# Patient Record
Sex: Female | Born: 1975
Health system: Southern US, Community
[De-identification: ages and names within clinical notes are randomized; demographics above are authoritative.]

## PROBLEM LIST (undated history)

## (undated) DIAGNOSIS — M1990 Unspecified osteoarthritis, unspecified site: Secondary | ICD-10-CM

## (undated) HISTORY — PX: OTHER SURGICAL HISTORY: SHX169

---

## 2000-08-30 ENCOUNTER — Other Ambulatory Visit: Admission: RE | Admit: 2000-08-30 | Discharge: 2000-08-30 | Payer: Self-pay | Admitting: Obstetrics and Gynecology

## 2001-02-16 ENCOUNTER — Other Ambulatory Visit: Admission: RE | Admit: 2001-02-16 | Discharge: 2001-02-16 | Payer: Self-pay | Admitting: Obstetrics and Gynecology

## 2001-05-03 ENCOUNTER — Encounter (INDEPENDENT_AMBULATORY_CARE_PROVIDER_SITE_OTHER): Payer: Self-pay

## 2001-05-03 ENCOUNTER — Other Ambulatory Visit: Admission: RE | Admit: 2001-05-03 | Discharge: 2001-05-03 | Payer: Self-pay | Admitting: Obstetrics and Gynecology

## 2001-10-01 ENCOUNTER — Other Ambulatory Visit: Admission: RE | Admit: 2001-10-01 | Discharge: 2001-10-01 | Payer: Self-pay | Admitting: Obstetrics and Gynecology

## 2002-04-22 ENCOUNTER — Other Ambulatory Visit: Admission: RE | Admit: 2002-04-22 | Discharge: 2002-04-22 | Payer: Self-pay | Admitting: Obstetrics and Gynecology

## 2002-11-29 ENCOUNTER — Other Ambulatory Visit: Admission: RE | Admit: 2002-11-29 | Discharge: 2002-11-29 | Payer: Self-pay | Admitting: Obstetrics and Gynecology

## 2004-02-05 ENCOUNTER — Other Ambulatory Visit: Admission: RE | Admit: 2004-02-05 | Discharge: 2004-02-05 | Payer: Self-pay | Admitting: Obstetrics and Gynecology

## 2005-03-23 ENCOUNTER — Other Ambulatory Visit: Admission: RE | Admit: 2005-03-23 | Discharge: 2005-03-23 | Payer: Self-pay | Admitting: Obstetrics and Gynecology

## 2006-03-23 ENCOUNTER — Ambulatory Visit (HOSPITAL_COMMUNITY): Admission: RE | Admit: 2006-03-23 | Discharge: 2006-03-23 | Payer: Self-pay | Admitting: Specialist

## 2009-03-07 ENCOUNTER — Encounter: Payer: Self-pay | Admitting: Orthopedic Surgery

## 2009-03-07 ENCOUNTER — Emergency Department (HOSPITAL_COMMUNITY): Admission: EM | Admit: 2009-03-07 | Discharge: 2009-03-07 | Payer: Self-pay | Admitting: Emergency Medicine

## 2009-03-09 ENCOUNTER — Ambulatory Visit: Payer: Self-pay | Admitting: Orthopedic Surgery

## 2009-03-09 DIAGNOSIS — S8263XA Displaced fracture of lateral malleolus of unspecified fibula, initial encounter for closed fracture: Secondary | ICD-10-CM

## 2009-03-17 ENCOUNTER — Encounter: Payer: Self-pay | Admitting: Orthopedic Surgery

## 2009-04-20 ENCOUNTER — Ambulatory Visit: Payer: Self-pay | Admitting: Orthopedic Surgery

## 2009-06-25 ENCOUNTER — Ambulatory Visit: Payer: Self-pay | Admitting: Orthopedic Surgery

## 2009-06-25 DIAGNOSIS — M2240 Chondromalacia patellae, unspecified knee: Secondary | ICD-10-CM

## 2009-06-26 ENCOUNTER — Encounter (INDEPENDENT_AMBULATORY_CARE_PROVIDER_SITE_OTHER): Payer: Self-pay | Admitting: *Deleted

## 2009-07-03 ENCOUNTER — Ambulatory Visit (HOSPITAL_COMMUNITY): Admission: RE | Admit: 2009-07-03 | Discharge: 2009-07-03 | Payer: Self-pay | Admitting: Orthopedic Surgery

## 2009-07-29 ENCOUNTER — Ambulatory Visit: Payer: Self-pay | Admitting: Orthopedic Surgery

## 2009-08-11 ENCOUNTER — Encounter (HOSPITAL_COMMUNITY): Admission: RE | Admit: 2009-08-11 | Discharge: 2009-09-10 | Payer: Self-pay | Admitting: Orthopedic Surgery

## 2009-09-11 ENCOUNTER — Encounter (HOSPITAL_COMMUNITY): Admission: RE | Admit: 2009-09-11 | Discharge: 2009-10-11 | Payer: Self-pay | Admitting: Orthopedic Surgery

## 2009-09-16 ENCOUNTER — Encounter: Payer: Self-pay | Admitting: Orthopedic Surgery

## 2009-10-21 ENCOUNTER — Ambulatory Visit: Payer: Self-pay | Admitting: Orthopedic Surgery

## 2010-03-19 HISTORY — PX: CHOLECYSTECTOMY: SHX55

## 2010-03-19 HISTORY — PX: DIAGNOSTIC LAPAROSCOPY: SUR761

## 2011-04-21 ENCOUNTER — Emergency Department (HOSPITAL_COMMUNITY)
Admission: EM | Admit: 2011-04-21 | Discharge: 2011-04-22 | Disposition: A | Discharge status: Home / Self Care | Payer: Medicaid Other | Attending: Emergency Medicine | Admitting: Emergency Medicine

## 2011-04-21 DIAGNOSIS — R05 Cough: Secondary | ICD-10-CM | POA: Insufficient documentation

## 2011-04-21 DIAGNOSIS — R059 Cough, unspecified: Secondary | ICD-10-CM | POA: Insufficient documentation

## 2011-04-21 DIAGNOSIS — J209 Acute bronchitis, unspecified: Secondary | ICD-10-CM | POA: Insufficient documentation

## 2011-04-23 ENCOUNTER — Emergency Department (HOSPITAL_COMMUNITY)
Admission: EM | Admit: 2011-04-23 | Discharge: 2011-04-23 | Disposition: A | Discharge status: Home / Self Care | Payer: Self-pay | Attending: Emergency Medicine | Admitting: Emergency Medicine

## 2011-04-23 ENCOUNTER — Emergency Department (HOSPITAL_COMMUNITY): Payer: Self-pay

## 2011-04-23 DIAGNOSIS — J45909 Unspecified asthma, uncomplicated: Secondary | ICD-10-CM | POA: Insufficient documentation

## 2011-04-23 DIAGNOSIS — Z79899 Other long term (current) drug therapy: Secondary | ICD-10-CM | POA: Insufficient documentation

## 2011-05-06 NOTE — H&P (Signed)
NAME:  KARRIS, DEANGELO              ACCOUNT NO.:  [id number]   MEDICAL RECORD NO.:  [id number]          PATIENT TYPE:  AMB   LOCATION:                                FACILITY:  [facility]   PHYSICIAN:  Dirk Dress. Katrinka Blazing, M.D.   DATE OF BIRTH:  [birth year]   DATE OF ADMISSION:  DATE OF DISCHARGE:  LH                                HISTORY & PHYSICAL   This is a 35 year old female with history of progressive severe  constipation.  She has had episodic rectal bleeding, the last episode was  two weeks ago.  She had a strong family history of colon cancer and she is  referred for colonoscopy.   PAST MEDICAL HISTORY:  She has no medical illness.  Her only medication is  Depo-Provera for contraception.   ALLERGIES:  None.   PAST SURGICAL HISTORY:  None.   PHYSICAL EXAMINATION:  VITAL SIGNS:  Blood pressure 120/79, pulse 85,  respirations 18, weight 192 pounds.  HEENT:  Unremarkable.  NECK:  Supple.  No JVD, bruit, adenopathy or thyromegaly.  CHEST:  Clear to auscultation.  HEART:  Regular rate and rhythm without murmur, rub, or gallop.  ABDOMEN:  Soft and nontender.  No masses.  EXTREMITIES:  No clubbing, cyanosis, or edema.  NEUROLOGICAL:  No focal motor, sensory or cerebellar deficit.   IMPRESSION:  1.  Rectal bleeding, recurrent.  2.  Constipation.  3.  Family history of colon cancer.   PLAN:  Total colonoscopy.      Dirk Dress. Katrinka Blazing, M.D.  Electronically Signed     LCS/MEDQ  D:  03/22/2006  T:  03/22/2006  Job:  [id number]

## 2012-02-29 ENCOUNTER — Encounter: Payer: Self-pay | Admitting: Orthopedic Surgery

## 2012-02-29 ENCOUNTER — Telehealth: Payer: Self-pay | Admitting: Orthopedic Surgery

## 2012-02-29 ENCOUNTER — Ambulatory Visit (INDEPENDENT_AMBULATORY_CARE_PROVIDER_SITE_OTHER): Payer: Medicaid Other | Admitting: Orthopedic Surgery

## 2012-02-29 VITALS — BP 98/60 | Ht 69.0 in | Wt 215.0 lb

## 2012-02-29 DIAGNOSIS — M2240 Chondromalacia patellae, unspecified knee: Secondary | ICD-10-CM

## 2012-02-29 DIAGNOSIS — M2241 Chondromalacia patellae, right knee: Secondary | ICD-10-CM | POA: Insufficient documentation

## 2012-02-29 MED ORDER — IBUPROFEN 800 MG PO TABS: 800.0000 mg | ORAL_TABLET | Freq: Three times a day (TID) | ORAL | Status: AC | PRN

## 2012-02-29 MED ORDER — TRAMADOL-ACETAMINOPHEN 37.5-325 MG PO TABS: 1.0000 | ORAL_TABLET | ORAL | Status: AC | PRN

## 2012-02-29 NOTE — Progress Notes (Signed)
Patient ID: Stacie Roberts, female   DOB: Nov 13, 1976, 36 y.o.   MRN: [id number] Knee  Injection Procedure Note  Pre-operative Diagnosis: left knee oa  Post-operative Diagnosis: same  Indications: pain  Anesthesia: ethyl chloride   Procedure Details   Verbal consent was obtained for the procedure. Time out was completed.The joint was prepped with alcohol, followed by  Ethyl chloride spray and A 20 gauge needle was inserted into the knee via lateral approach; 4ml 1% lidocaine and 1 ml of depomedrol  was then injected into the joint . The needle was removed and the area cleansed and dressed.  Complications:  None; patient tolerated the procedure well.  Knee  Injection Procedure Note  Pre-operative Diagnosis: right knee oa  Post-operative Diagnosis: same  Indications: pain  Anesthesia: ethyl chloride   Procedure Details   Verbal consent was obtained for the procedure. Time out was completed.The joint was prepped with alcohol, followed by  Ethyl chloride spray and A 20 gauge needle was inserted into the knee via lateral approach; 4ml 1% lidocaine and 1 ml of depomedrol  was then injected into the joint . The needle was removed and the area cleansed and dressed.  Complications:  None; patient tolerated the procedure well.   Subjective:    Stacie Roberts is a 36 y.o. female who presents her current bilateral anterior knee pain LEFT greater than RIGHT.  I saw her back in 2010 she had an MRI of the LEFT knee showed chondromalacia was otherwise normal exam  She continues now with anterior knee pain bilaterally LEFT greater than RIGHT she feels like the knee is swollen.  She has anterior and posterior joint pain.  She has difficulty going up and down the steps.  The knee feels like it will give out at any point.  There's been no new injury.  She's lost 17 pounds on weight loss medication  She has NO KNOWN DRUG ALLERGIES.  She has a history of colitis.  She's had a colonoscopy.  She is a  family history of diabetes and coronary artery disease as well as asthma.  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  Examination shows a well-developed well-nourished female mildly obese.  She has normal cardiovascular findings.  No lymphadenopathy.  Skin is normal in both lower extremities.  No neurologic deficits in either extremity.  Straight leg raises are negative.  She's awake alert and oriented x3 her mood and affect are normal  She walks without any support for ambulation she does wear some sleeves on the knees at times and brought those today  She has bilateral crepitation and painful patella compression throughout the range of motion.  Her knee range of motion is normal with knee hyperextension of 10 bilaterally.  Muscle tone is normal knees are stable.  There is apprehension as well and the patella.  X-ray was done to the LEFT knee including an AP of both knees which shows normal joint lines all 3 compartments    Assessment:    Bilateral Moderate patellofemoral syndrome bilaterally    Plan:    NSAIDs per medication orders. Arthrocentesis. See procedure note. PT referral.  Ultracet for pain

## 2012-02-29 NOTE — Patient Instructions (Signed)
You have received a steroid shot. 15% of patients experience increased pain at the injection site with in the next 24 hours. This is best treated with ice and tylenol extra strength 2 tabs every 8 hours. If you are still having pain please call the office.    Patella Problems (Patellofemoral Syndrome) This syndrome is caused by changes in the undersurface of the kneecap (patella). The changes vary from minor inflammation to major changes such as breakdown of the cartilage on the undersurface of the patella. The major changes can be seen with an arthroscope (a small, pencil-sized telescope). These changes can result from various factors. These factors may arise from abnormal tracking (movement or malalignment) of the patella. Normally the Patella is in its normal groove located between the condyles (grooved end) of the femur (thigh bone). Abnormal movement leads to increased pressure in the patellofemoral joint. This leads to swelling in the cartilage, inflammation and pain. SYMPTOMS   The patient with this syndrome usually has an ache in the knee. It is often aggravated by:  Prolonged sitting.   Squatting.   Climbing stairs.   Running down hill.   Other exercising that stresses the knee.  Other findings may include the knee giving way, swelling, and or locking. TREATMENT   The treatment will depend on the cause of the problem. Sometimes the solution is as simple as cutting down on activities. Giving your joint a rest with the use of crutches and braces can also help. This is generally followed by strengthening exercises. RECOVERY Recovery from a patellar problem depends on the type of problem in your knee and on the treatment required. If conservative treatment works the recovery period may be as little as three to four weeks. If more aggressive therapy such as surgery is required, the recovery period may be several months. Your caregiver will discuss this with you. HOME CARE  INSTRUCTIONS  Following exercise, use an ice pack for twenty to thirty minutes three to four times per day. Use a towel between your ice pack and the skin.   Reduction of inflammation with anti-inflammatories may be helpful. Only take over-the-counter or prescription medicines for pain, discomfort, or fever as directed by your caregiver.   Taping the knee or using a neoprene sleeve with a patellar cutout to provide better tracking of the patella may give relief.   Muscle (quadriceps) strengthening exercises are helpful. Follow your caregiver's advice.   Muscle stretching prior to exercise may be helpful.   Soft tissue therapy using ultrasound, and diathermy may be helpful.   If conservative therapy is not effective, surgery may provide relief. During arthroscopy, your caregiver may discover a rough surface beneath your kneecap. If this happens, your caregiver may smooth this out by shaving the surface.  SEEK MEDICAL CARE IF: If you have surgery, see your caregiver if:  There is increased bleeding or clear fluid (more than a small spot) from the wound.   You notice redness, swelling, or increasing pain in the wound.   Pus is coming from wound.   You develop an unexplained oral temperature above 102 F (38.9 C) develops, or as your caregiver suggests.   You notice a foul smell coming from the wound or dressing.   You develop increasing pain or stiffness in your knee.  SEEK IMMEDIATE MEDICAL CARE IF:    You develop a rash.   You have difficulty breathing.   You have any allergic problems.  MAKE SURE YOU:    Understand these  instructions.   Will watch your condition.   Will get help right away if you are not doing well or get worse.  Document Released: 12/02/2000 Document Revised: 11/24/2011 Document Reviewed: 12/22/2008 [facility] Patient Information 2012 [place], [place].

## 2012-02-29 NOTE — Telephone Encounter (Signed)
Patient requests a physical therapy location in the [place] area where she works.  Called her with information of locations; she states she has heard about [facility] (they have 2 locations in this area). She elects to go to this facility at the following location, and order was faxed there today: [facility] [address] [place], [place] [address] Phone: [phone] Fax: [phone]

## 2012-05-30 ENCOUNTER — Ambulatory Visit (INDEPENDENT_AMBULATORY_CARE_PROVIDER_SITE_OTHER): Payer: 59 | Admitting: Orthopedic Surgery

## 2012-05-30 ENCOUNTER — Encounter: Payer: Self-pay | Admitting: Orthopedic Surgery

## 2012-05-30 VITALS — BP 116/82 | Ht 69.0 in | Wt 214.0 lb

## 2012-05-30 DIAGNOSIS — M79609 Pain in unspecified limb: Secondary | ICD-10-CM

## 2012-05-30 DIAGNOSIS — M25562 Pain in left knee: Secondary | ICD-10-CM | POA: Insufficient documentation

## 2012-05-30 DIAGNOSIS — M79606 Pain in leg, unspecified: Secondary | ICD-10-CM

## 2012-05-30 MED ORDER — PREDNISONE 5 MG PO KIT: 5.0000 mg | PACK | ORAL | Status: DC

## 2012-05-30 MED ORDER — GABAPENTIN 100 MG PO CAPS: 100.0000 mg | ORAL_CAPSULE | Freq: Three times a day (TID) | ORAL | Status: DC

## 2012-05-30 NOTE — Patient Instructions (Signed)
Pick up new prescriptions and continue ibuprofen and tramadol

## 2012-05-30 NOTE — Progress Notes (Signed)
Patient ID: Stacie Roberts, female   DOB: [birth year], 36 y.o.   MRN: [id number] Chief Complaint  Patient presents with  . Knee Pain    bilateral knee pain    BP 116/82  Ht 5\' 9"  (1.753 m)  Wt 214 lb (97.07 kg)  BMI 31.60 kg/m2  Review of systems negative for numbness or tingling, but positive for weakness of the lower extremity  Still symptomatic in both lower extremities however, her symptoms seem a day in some regards. Her pain is radiating down her RIGHT leg and also down the back of her LEFT knee, although she has no back pain in her back. Exam today is essentially normal with good flexion. Slight limitation of extension, but overall, normal range of motion straight leg raises are negative.  She does not have classic anterior knee pain and there is no pain behind the patella except on occasion. She does get increased symptoms as described when she is standing, especially when she is getting up.  I have recommended we change her medications to Neurontin, and a steroid dose pack to see if this helps.  Come back in 6 weeks. Doubt anterior knee pain as a primary symptom at this time

## 2012-07-25 ENCOUNTER — Ambulatory Visit: Payer: 59 | Admitting: Orthopedic Surgery

## 2012-07-31 ENCOUNTER — Ambulatory Visit (INDEPENDENT_AMBULATORY_CARE_PROVIDER_SITE_OTHER): Payer: 59 | Admitting: Orthopedic Surgery

## 2012-07-31 ENCOUNTER — Encounter: Payer: Self-pay | Admitting: Orthopedic Surgery

## 2012-07-31 VITALS — BP 104/70 | Ht 69.0 in | Wt 220.0 lb

## 2012-07-31 DIAGNOSIS — M79606 Pain in leg, unspecified: Secondary | ICD-10-CM

## 2012-07-31 DIAGNOSIS — M79609 Pain in unspecified limb: Secondary | ICD-10-CM

## 2012-07-31 DIAGNOSIS — M2240 Chondromalacia patellae, unspecified knee: Secondary | ICD-10-CM

## 2012-07-31 NOTE — Progress Notes (Signed)
Patient ID: Stacie Roberts, female   DOB: 10-Jul-1976, 36 y.o.   MRN: [id number] Chief Complaint  Patient presents with  . Follow-up    recheck bilateral knee pain    BP 104/70  Ht 5\' 9"  (1.753 m)  Wt 220 lb (99.791 kg)  BMI 32.49 kg/m2  Routine followup visit scheduled.  Patient complains of less leg pain still has some anterior knee pain thinks to chair may help her in terms of the position of her legs while seated. The Neurontin which she is taking twice a day seems to have decreased some of the leg pain  Physical Exam  Constitutional: She is oriented to person, place, and time. She appears well-developed and well-nourished.  Neurological: She is alert and oriented to person, place, and time. She exhibits normal muscle tone.  Skin: Skin is warm and dry.  Psychiatric: She has a normal mood and affect. Her behavior is normal. Judgment and thought content normal.  Right Knee Exam   Tenderness  The patient is experiencing tenderness in the patella.  Range of Motion  The patient has normal right knee ROM.  Muscle Strength   The patient has normal right knee strength.  Tests  McMurray:  Medial - negative Lateral - negative Lachman:  Anterior - negative    Posterior - negative Drawer:       Anterior - negative    Posterior - negative Patellar Apprehension: positive  Other  Pulse: present Swelling: none   Left Knee Exam   Tenderness  The patient is experiencing tenderness in the patella.  Range of Motion  The patient has normal left knee ROM.  Muscle Strength   The patient has normal left knee strength.  Tests  McMurray:  Medial - negative Lateral - negative Lachman:  Anterior - negative    Posterior - negative Drawer:       Anterior - negative     Posterior - negative Patellar Apprehension: positive  Other  Pulse: present Swelling: none     The patient #1 is patellofemoral disease control  The patient #2 has some unexplained referred pain seems to be  controlled with Neurontin  Recommend continue Neurontin twice a day but increase dose to 200 mg at night followup 3 months

## 2012-07-31 NOTE — Patient Instructions (Signed)
Increase neurontin to 200 mg hs

## 2012-10-30 ENCOUNTER — Ambulatory Visit (INDEPENDENT_AMBULATORY_CARE_PROVIDER_SITE_OTHER): Payer: 59 | Admitting: Orthopedic Surgery

## 2012-10-30 ENCOUNTER — Encounter: Payer: Self-pay | Admitting: Orthopedic Surgery

## 2012-10-30 VITALS — Ht 69.0 in | Wt 220.0 lb

## 2012-10-30 DIAGNOSIS — M2240 Chondromalacia patellae, unspecified knee: Secondary | ICD-10-CM

## 2012-10-30 DIAGNOSIS — M2241 Chondromalacia patellae, right knee: Secondary | ICD-10-CM

## 2012-10-30 NOTE — Patient Instructions (Addendum)
Dr will mail order when form comes   Continue tramadol and neurontin, ibuprofen

## 2012-10-30 NOTE — Progress Notes (Signed)
Patient ID: Stacie Roberts, female   DOB: November 29, 1976, 36 y.o.   MRN: [id number] Chief Complaint  Patient presents with  . Follow-up    3 month recheck on knees   Current Outpatient Prescriptions on File Prior to Visit  Medication Sig Dispense Refill  . estradiol cypionate (DEPO-ESTRADIOL) 5 MG/ML injection Inject into the muscle every 28 (twenty-eight) days.      Marland Kitchen gabapentin (NEURONTIN) 100 MG capsule Take 1 capsule (100 mg total) by mouth 3 (three) times daily.  90 capsule  2  . phentermine (ADIPEX-P) 37.5 MG tablet Take 37.5 mg by mouth daily before breakfast.      . PredniSONE 5 MG KIT Take 1 kit (5 mg total) by mouth as directed.  48 each  0    The patient is maintained on the 100 mg of Neurontin during the day and 200 mg at night which is working well. She does have some mild anterior knee pain which has persisted. The right knee is worse in the left with more crepitation and grinding and she still has trouble descending the stairs  She has not had any locking catching or giving way episodes.  The numbness and burning pain in her legs has been controlled with Neurontin  Physical exam Ht 5\' 9"  (1.753 m)  Wt 220 lb (99.791 kg)  BMI 32.49 kg/m2 The patient is awake alert and oriented x3 mood and affect are normal general appearance is normal she is moderately obese.  Right knee crepitus throughout range of motion clicking noted from 90 of flexion towards extension. Knee is stable, mild quadriceps tendon tenderness. Patella tendon slightly tender. Knee otherwise stable  Left knee crepitance mild clicking noted as the knee goes from flexion towards full extension less crepitance in the right knee mild quadriceps tendon tendinitis. Patella tendon nontender. Knee otherwise stable.  Continue Neurontin, continue ibuprofen, continue tramadol  Followup in 6 months for reevaluation.   Diagnosis chondromalacia

## 2012-12-26 ENCOUNTER — Other Ambulatory Visit: Payer: Self-pay | Admitting: *Deleted

## 2012-12-26 DIAGNOSIS — M79606 Pain in leg, unspecified: Secondary | ICD-10-CM

## 2012-12-26 MED ORDER — TRAMADOL-ACETAMINOPHEN 37.5-325 MG PO TABS: 1.0000 | ORAL_TABLET | ORAL | Status: DC | PRN

## 2013-02-16 ENCOUNTER — Encounter: Payer: Self-pay | Admitting: *Deleted

## 2013-03-12 ENCOUNTER — Ambulatory Visit (INDEPENDENT_AMBULATORY_CARE_PROVIDER_SITE_OTHER): Payer: 59

## 2013-03-12 DIAGNOSIS — Z309 Encounter for contraceptive management, unspecified: Secondary | ICD-10-CM

## 2013-03-12 MED ORDER — MEDROXYPROGESTERONE ACETATE 150 MG/ML IM SUSP
150.0000 mg | Freq: Once | INTRAMUSCULAR | Status: AC
  Administered 2013-03-12: 150 mg via INTRAMUSCULAR

## 2013-03-13 ENCOUNTER — Telehealth: Payer: Self-pay | Admitting: Orthopedic Surgery

## 2013-03-13 NOTE — Telephone Encounter (Signed)
No I cant do anything else

## 2013-03-13 NOTE — Telephone Encounter (Signed)
Stacie Roberts says her knee pain seems to be worse.  She is taking Gabapentin. Ibuprofen,and Tramadol as directed but does not think it is doing much good. She is not scheduled to see you until 05/02/13 for a recheck. She is asking if she needs to come in sooner.  Please advise.  Cell # [phone]/work # [id number]

## 2013-03-14 NOTE — Telephone Encounter (Signed)
Advised patient of doctor's reply

## 2013-05-02 ENCOUNTER — Encounter: Payer: Self-pay | Admitting: Orthopedic Surgery

## 2013-05-02 ENCOUNTER — Ambulatory Visit (INDEPENDENT_AMBULATORY_CARE_PROVIDER_SITE_OTHER): Payer: 59 | Admitting: Orthopedic Surgery

## 2013-05-02 VITALS — BP 118/60 | Ht 69.0 in | Wt 220.0 lb

## 2013-05-02 DIAGNOSIS — M2240 Chondromalacia patellae, unspecified knee: Secondary | ICD-10-CM

## 2013-05-02 DIAGNOSIS — M2241 Chondromalacia patellae, right knee: Secondary | ICD-10-CM

## 2013-05-02 MED ORDER — DICLOFENAC POTASSIUM 50 MG PO TABS: 50.0000 mg | ORAL_TABLET | Freq: Two times a day (BID) | ORAL | Status: DC

## 2013-05-02 MED ORDER — ACETAMINOPHEN-CODEINE #3 300-30 MG PO TABS: 1.0000 | ORAL_TABLET | Freq: Four times a day (QID) | ORAL | Status: DC | PRN

## 2013-05-02 NOTE — Patient Instructions (Signed)
PT  You have received a steroid shot. 15% of patients experience increased pain at the injection site with in the next 24 hours. This is best treated with ice and tylenol extra strength 2 tabs every 8 hours. If you are still having pain please call the office.    Patella Problems (Patellofemoral Syndrome) This syndrome is caused by changes in the undersurface of the kneecap (patella). The changes vary from minor inflammation to major changes such as breakdown of the cartilage on the undersurface of the patella. The major changes can be seen with an arthroscope (a small, pencil-sized telescope). These changes can result from various factors. These factors may arise from abnormal tracking (movement or malalignment) of the patella. Normally the Patella is in its normal groove located between the condyles (grooved end) of the femur (thigh bone). Abnormal movement leads to increased pressure in the patellofemoral joint. This leads to swelling in the cartilage, inflammation and pain. SYMPTOMS  The patient with this syndrome usually has an ache in the knee. It is often aggravated by:  Prolonged sitting.  Squatting.  Climbing stairs.  Running down hill.  Other exercising that stresses the knee. Other findings may include the knee giving way, swelling, and or locking. TREATMENT  The treatment will depend on the cause of the problem. Sometimes the solution is as simple as cutting down on activities. Giving your joint a rest with the use of crutches and braces can also help. This is generally followed by strengthening exercises. RECOVERY Recovery from a patellar problem depends on the type of problem in your knee and on the treatment required. If conservative treatment works the recovery period may be as little as three to four weeks. If more aggressive therapy such as surgery is required, the recovery period may be several months. Your caregiver will discuss this with you. HOME CARE  INSTRUCTIONS  Following exercise, use an ice pack for twenty to thirty minutes three to four times per day. Use a towel between your ice pack and the skin.  Reduction of inflammation with anti-inflammatories may be helpful. Only take over-the-counter or prescription medicines for pain, discomfort, or fever as directed by your caregiver.  Taping the knee or using a neoprene sleeve with a patellar cutout to provide better tracking of the patella may give relief.  Muscle (quadriceps) strengthening exercises are helpful. Follow your caregiver's advice.  Muscle stretching prior to exercise may be helpful.  Soft tissue therapy using ultrasound, and diathermy may be helpful.  If conservative therapy is not effective, surgery may provide relief. During arthroscopy, your caregiver may discover a rough surface beneath your kneecap. If this happens, your caregiver may smooth this out by shaving the surface. SEEK MEDICAL CARE IF: If you have surgery, see your caregiver if:  There is increased bleeding or clear fluid (more than a small spot) from the wound.  You notice redness, swelling, or increasing pain in the wound.  Pus is coming from wound.  You develop an unexplained oral temperature above 102 F (38.9 C) develops, or as your caregiver suggests.  You notice a foul smell coming from the wound or dressing.  You develop increasing pain or stiffness in your knee. SEEK IMMEDIATE MEDICAL CARE IF:   You develop a rash.  You have difficulty breathing.  You have any allergic problems. MAKE SURE YOU:   Understand these instructions.  Will watch your condition.  Will get help right away if you are not doing well or get worse. Document Released:  12/02/2000 Document Revised: 02/27/2012 Document Reviewed: 12/22/2008 [facility] Patient Information 2013 [place], [place].

## 2013-05-02 NOTE — Progress Notes (Signed)
  Subjective:    Patient ID: Stacie Roberts, female    DOB: 1976-01-06, 37 y.o.   MRN: [id number]  HPI Comments: Patient presents with:   Follow-up - follow up bilateral knees, anterior knee pain increasing on Ultracet and ibuprofen   This is a 37 year-old female history bilateral knee pain diagnosed with chondromalacia MRI done left knee no ligament or meniscal damage. Treated with ibuprofen exercises and Ultracet. Pain is worsening especially in the right with anterior knee pain pain with sitting pain with climbing steps       Review of Systems  Cardiovascular: Negative for leg swelling.  Neurological: Positive for weakness. Negative for numbness.       Objective:   Physical Exam BP 118/60  Ht 5\' 9"  (1.753 m)  Wt 220 lb (99.791 kg)  BMI 32.47 kg/m2 General appearance is normal the patient's oriented x3 she has a normal mood and affect is walking without assistive devices no cane or crutch no limp  Bilateral knee examination she really has no tenderness along the joint lines but she has crepitance and pain with patellofemoral compression throughout the range of motion which is full knees are stable patella are stable strength is normal muscle tone is good skin is intact pulses are normal sensation is normal       Assessment & Plan:  Chondromalacia of both patellae - Plan: diclofenac (CATAFLAM) 50 MG tablet, acetaminophen-codeine (TYLENOL #3) 300-30 MG per tablet, Ambulatory referral to Physical Therapy  Recommend repeat physical therapy course. Followup as needed. Inject both knees appear  Knee  Injection Procedure Note  Pre-operative Diagnosis: left knee oa  Post-operative Diagnosis: same  Indications: pain  Anesthesia: ethyl chloride   Procedure Details   Verbal consent was obtained for the procedure. Time out was completed.The joint was prepped with alcohol, followed by  Ethyl chloride spray and A 20 gauge needle was inserted into the knee via lateral approach;  4ml 1% lidocaine and 1 ml of depomedrol  was then injected into the joint . The needle was removed and the area cleansed and dressed.  Complications:  None; patient tolerated the procedure well. Knee  Injection Procedure Note  Pre-operative Diagnosis: right knee oa  Post-operative Diagnosis: same  Indications: pain  Anesthesia: ethyl chloride   Procedure Details   Verbal consent was obtained for the procedure. Time out was completed.The joint was prepped with alcohol, followed by  Ethyl chloride spray and A 20 gauge needle was inserted into the knee via lateral approach; 4ml 1% lidocaine and 1 ml of depomedrol  was then injected into the joint . The needle was removed and the area cleansed and dressed.  Complications:  None; patient tolerated the procedure well.

## 2013-05-22 ENCOUNTER — Ambulatory Visit (HOSPITAL_COMMUNITY)
Admission: RE | Admit: 2013-05-22 | Discharge: 2013-05-22 | Disposition: A | Discharge status: Home / Self Care | Payer: 59 | Source: Ambulatory Visit | Attending: Orthopedic Surgery | Admitting: Orthopedic Surgery

## 2013-05-22 DIAGNOSIS — M2241 Chondromalacia patellae, right knee: Secondary | ICD-10-CM | POA: Diagnosis present

## 2013-05-22 DIAGNOSIS — M25562 Pain in left knee: Secondary | ICD-10-CM | POA: Diagnosis present

## 2013-05-22 DIAGNOSIS — M79606 Pain in leg, unspecified: Secondary | ICD-10-CM | POA: Diagnosis present

## 2013-05-22 DIAGNOSIS — R269 Unspecified abnormalities of gait and mobility: Secondary | ICD-10-CM | POA: Insufficient documentation

## 2013-05-22 DIAGNOSIS — IMO0001 Reserved for inherently not codable concepts without codable children: Secondary | ICD-10-CM | POA: Insufficient documentation

## 2013-05-22 DIAGNOSIS — M25569 Pain in unspecified knee: Secondary | ICD-10-CM | POA: Insufficient documentation

## 2013-05-24 NOTE — Evaluation (Signed)
Physical Therapy Evaluation  Patient Details  Name: Stacie Roberts MRN: [id number] Date of Birth: 1976-03-08  Today's Date: 05/24/2013 Time: 1700-1730 PT Time Calculation (min): 30 min              Visit#: 1 of 4  Re-eval:   Assessment Diagnosis: Bil Chonromalacia patella Next MD Visit: Dr. Romeo Apple Prior Therapy: 2x in past  Past Medical History:  Past Medical History  Diagnosis Date  . Diabetes mellitus without complication   . Myocardial infarction   . Cancer pancreatic maternal grandmother  . Colon cancer maternal uncles  . Lung cancer mother   Past Surgical History:  Past Surgical History  Procedure Laterality Date  . Diagnostic laparoscopy  03/2010  . Cholecystectomy  03/2010   Subjective Symptoms/Limitations Symptoms: Pt is a 37 year old female referred to PT for BLE knee pain (L>R).  She reports she has attended PT 2x in the past for exercises and e-stim without success.  MD would like her to try again for complaince with HEP.  Reports she has been on pain medication in the past and Dr. Romeo Apple has recently changed it and is working better compared to before.  Limitations: Walking;Standing;Sitting (stairs) Pain Assessment Currently in Pain?: Yes Pain Score:   6 Pain Location: Knee Pain Orientation: Right;Left Pain Type: Chronic pain Pain Onset: More than a month ago Pain Frequency: Intermittent Pain Relieving Factors: pain medication Effect of Pain on Daily Activities: going up and down stairs, walking long distances.   Balance Screening Balance Screen Has the patient fallen in the past 6 months: No Has the patient had a decrease in activity level because of a fear of falling? : No Is the patient reluctant to leave their home because of a fear of falling? : No  Prior Functioning  Prior Function Vocation: Full time employment  Sensation/Coordination/Flexibility/Functional Tests Flexibility 90/90: Positive Functional Tests Functional Tests: +  elys  Assessment RLE Strength Right Hip Flexion: 4/5 Right Hip Extension: 4/5 Right Hip ABduction: 4/5 LLE Strength Left Hip Flexion: 4/5 Left Hip Extension: 4/5 Left Hip ABduction: 4/5 Palpation Palpation: fascial restrictions to BLE: medial knee (possible plica), pes anserine, quadriceps, IT band, distal hamstring insertions  Mobility/Balance  Ambulation/Gait Ambulation/Gait: Yes Gait Pattern: Antalgic;Lateral hip instability   Exercise/Treatments Stretches Active Hamstring Stretch: 1 rep;30 seconds Quad Stretch: 1 rep;30 seconds (prone)  Supine Straight Leg Raises: Both;10 reps Patellar Mobs: demonstration and instruction Sidelying Hip ABduction: Both;10 reps Hip ADduction: Both;10 reps Prone  Hip Extension: Both;10 reps  Modalities Modalities: Ultrasound Ultrasound Ultrasound Location: Lt medial knee  Ultrasound Parameters: 0.8 w/cm2, continous, (medium head) Ultrasound Goals: Pain  Physical Therapy Assessment and Plan PT Assessment and Plan Clinical Impression Statement: Pt is a 37 year old female referred to PT for Bil knee pain/chondromalacia patella with findings of significant fascial restrictions and impaired flexibility (possibly plica related) which are most limiting.  PT Frequency: Min 1X/week PT Duration:  (3 weeks) PT Plan:  f/u with pain relief after [place], continue with manual techniques to decrease fascial restrictions to BLE (focusing around knees, distal hamstrings).  Stretching after manual.  Progres to hip and core strengthening activities. At this time will continue 1x/week x3 weeks per MD orders, will address if more visits are required, if pt is having favorable results from manual and modalities.     Goals Home Exercise Program Pt will Perform Home Exercise Program: Independently PT Goal: Perform Home Exercise Program - Progress: Goal set today PT Short Term  Goals Time to Complete Short Term Goals: 3 weeks PT Short Term Goal 1: Pt will  present with decreased fascial restrictions to BLE to improve QOL.  PT Short Term Goal 2: Pt will report pain less than 3/10 for 75% of her day for improved QOL.   Problem List Patient Active Problem List   Diagnosis Date Noted  . Leg pain 05/30/2012  . Chondromalacia of both patellae 02/29/2012  . CHONDROMALACIA PATELLA 06/25/2009  . CLOSED FRACTURE OF LATERAL MALLEOLUS 03/09/2009    PT Plan of Care PT Home Exercise Plan: see scanned report PT Patient Instructions: demonstration and instruction on patella mobs. answered questions about diagnosis.  Consulted and Agree with Plan of Care: Patient  Annett Fabian, MPT, ATC 05/24/2013, 8:45 AM  Physician Documentation Your signature is required to indicate approval of the treatment plan as stated above.  Please sign and either send electronically or make a copy of this report for your files and return this physician signed original.   Please mark one 1.__approve of plan  2. ___approve of plan with the following conditions.   ______________________________                                                          _____________________ Physician Signature                                                                                                             Date

## 2013-05-27 ENCOUNTER — Telehealth (HOSPITAL_COMMUNITY): Payer: Self-pay

## 2013-05-27 ENCOUNTER — Inpatient Hospital Stay (HOSPITAL_COMMUNITY): Admission: RE | Admit: 2013-05-27 | Payer: 59 | Source: Ambulatory Visit

## 2013-05-29 ENCOUNTER — Ambulatory Visit (INDEPENDENT_AMBULATORY_CARE_PROVIDER_SITE_OTHER): Payer: 59

## 2013-05-29 DIAGNOSIS — Z30013 Encounter for initial prescription of injectable contraceptive: Secondary | ICD-10-CM

## 2013-05-29 DIAGNOSIS — Z3009 Encounter for other general counseling and advice on contraception: Secondary | ICD-10-CM

## 2013-05-29 MED ORDER — MEDROXYPROGESTERONE ACETATE 150 MG/ML IM SUSP
150.0000 mg | Freq: Once | INTRAMUSCULAR | Status: AC
  Administered 2013-05-29: 150 mg via INTRAMUSCULAR

## 2013-06-05 ENCOUNTER — Inpatient Hospital Stay (HOSPITAL_COMMUNITY): Admission: RE | Admit: 2013-06-05 | Payer: 59 | Source: Ambulatory Visit

## 2013-06-05 ENCOUNTER — Telehealth (HOSPITAL_COMMUNITY): Payer: Self-pay

## 2013-06-12 ENCOUNTER — Inpatient Hospital Stay (HOSPITAL_COMMUNITY): Admission: RE | Admit: 2013-06-12 | Payer: 59 | Source: Ambulatory Visit

## 2013-06-12 ENCOUNTER — Telehealth (HOSPITAL_COMMUNITY): Payer: Self-pay

## 2013-07-17 ENCOUNTER — Other Ambulatory Visit: Payer: Self-pay | Admitting: Nurse Practitioner

## 2013-07-17 NOTE — Telephone Encounter (Signed)
LAST SEEN 1/14

## 2013-07-19 ENCOUNTER — Other Ambulatory Visit: Payer: Self-pay | Admitting: Nurse Practitioner

## 2013-08-14 ENCOUNTER — Ambulatory Visit (INDEPENDENT_AMBULATORY_CARE_PROVIDER_SITE_OTHER): Payer: 59

## 2013-08-14 DIAGNOSIS — Z30013 Encounter for initial prescription of injectable contraceptive: Secondary | ICD-10-CM

## 2013-08-14 DIAGNOSIS — Z3009 Encounter for other general counseling and advice on contraception: Secondary | ICD-10-CM

## 2013-08-14 MED ORDER — MEDROXYPROGESTERONE ACETATE 150 MG/ML IM SUSP
150.0000 mg | Freq: Once | INTRAMUSCULAR | Status: AC
  Administered 2013-08-14: 150 mg via INTRAMUSCULAR

## 2013-09-04 ENCOUNTER — Ambulatory Visit (INDEPENDENT_AMBULATORY_CARE_PROVIDER_SITE_OTHER): Payer: 59 | Admitting: Nurse Practitioner

## 2013-09-04 ENCOUNTER — Encounter: Payer: Self-pay | Admitting: Nurse Practitioner

## 2013-09-04 VITALS — BP 122/92 | Ht 68.5 in | Wt 240.8 lb

## 2013-09-04 DIAGNOSIS — Z23 Encounter for immunization: Secondary | ICD-10-CM

## 2013-09-04 DIAGNOSIS — Z01419 Encounter for gynecological examination (general) (routine) without abnormal findings: Secondary | ICD-10-CM

## 2013-09-04 DIAGNOSIS — Z0000 Encounter for general adult medical examination without abnormal findings: Secondary | ICD-10-CM

## 2013-09-04 MED ORDER — PHENTERMINE HCL 37.5 MG PO TABS: 37.5000 mg | ORAL_TABLET | Freq: Every day | ORAL | Status: DC

## 2013-09-04 MED ORDER — TETANUS-DIPHTHERIA TOXOIDS TD 5-2 LFU IM INJ: 0.5000 mL | INJECTION | Freq: Once | INTRAMUSCULAR | Status: DC

## 2013-09-04 MED ORDER — RANITIDINE HCL 300 MG PO TABS: 300.0000 mg | ORAL_TABLET | Freq: Every day | ORAL | Status: DC

## 2013-09-04 NOTE — Progress Notes (Signed)
Subjective:    Patient ID: Stacie Roberts, female    DOB: 06-22-1976, 37 y.o.   MRN: [id number]  HPI presents for wellness checkup. Is currently working and going to school. Also taking care of her mother who has end-stage lung cancer. Also has a teenage daughter. Has been under a lot more stress. Decreased activity due to busy lifestyle. Active job, walks a lot at work. Does not take any extra vitamin D and calcium. Minimal spotting on Depo-Provera. Same sexual partner. Some reflux symptoms lately. Drinks a lot of caffeine. No tobacco use. Occasional alcohol use. No anti-inflammatory use. PMH includes cholecystectomy. No abdominal pain. Has had her lab work done at work, has been asked send [place] a copy. Will also receive her flu vaccine at work.    Review of Systems  Constitutional: Positive for activity change. Negative for appetite change and fatigue.  HENT: Negative for congestion, sore throat, rhinorrhea and dental problem.   Eyes: Negative for visual disturbance.  Respiratory: Negative for cough, chest tightness and wheezing.   Cardiovascular: Negative for chest pain.  Gastrointestinal: Positive for constipation. Negative for nausea, vomiting, abdominal pain, diarrhea and blood in stool.  Genitourinary: Negative for dysuria, urgency, vaginal bleeding, vaginal discharge, difficulty urinating, genital sores, menstrual problem and pelvic pain.  Psychiatric/Behavioral: Negative for sleep disturbance and dysphoric mood. The patient is nervous/anxious.        Objective:   Physical Exam  Vitals reviewed. Constitutional: She is oriented to person, place, and time. She appears well-developed. No distress.  HENT:  Right Ear: External ear normal.  Left Ear: External ear normal.  Mouth/Throat: Oropharynx is clear and moist.  Neck: Normal range of motion. Neck supple. No tracheal deviation present. No thyromegaly present.  Cardiovascular: Normal rate, regular rhythm and normal heart sounds.   Exam reveals no gallop.   No murmur heard. Pulmonary/Chest: Effort normal and breath sounds normal.  Abdominal: Soft. She exhibits no distension. There is no tenderness.  Genitourinary: Vagina normal and uterus normal. No vaginal discharge found.  Musculoskeletal: She exhibits no edema.  Lymphadenopathy:    She has no cervical adenopathy.  Neurological: She is alert and oriented to person, place, and time.  Skin: Skin is warm and dry. No rash noted.  Psychiatric: She has a normal mood and affect. Her behavior is normal.   Breast exam: No masses noted, axilla no adenopathy. External GU normal. Vagina no discharge noted. Cervix Normal in appearance, no CMT. Uterus and adnexa normal, exam limited due to abdominal girth.       Assessment & Plan:  Well woman exam  Routine general medical examination at a health care facility - Plan: Dt vaccine greater than 7yo IM, DISCONTINUED: tetanus & diphtheria toxoids (adult) (TENIVAC) injection 0.5 mL, CANCELED: Td vaccine preservative free greater than or equal to 7yo IM Morbid Obesity GERD  Encouraged multivitamin with vitamin D and calcium. Discussed potential risk of bone loss associated with Depo-Provera use. Consider bone density study next year. Patient to send [place] a copy of her lab work done at her job. Decrease caffeine intake. Discussed importance of stress reduction. Next physical in one year. Given 1 more prescription for phentermine, after this must be off of it for 6 months.   Meds ordered this encounter  Medications  . phentermine (ADIPEX-P) 37.5 MG tablet    Sig: Take 1 tablet (37.5 mg total) by mouth daily before breakfast.    Dispense:  30 tablet    Refill:  2  Order Specific Question:  Supervising Provider    Answer:  Merlyn Albert [[id number]]  . DISCONTD: tetanus & diphtheria toxoids (adult) (TENIVAC) injection 0.5 mL    Sig:   . ranitidine (ZANTAC) 300 MG tablet    Sig: Take 1 tablet (300 mg total) by mouth at bedtime. Prn  acid reflux    Dispense:  30 tablet    Refill:  5    Order Specific Question:  Supervising Provider    Answer:  Riccardo Dubin

## 2013-09-06 ENCOUNTER — Encounter: Payer: Self-pay | Admitting: Nurse Practitioner

## 2013-09-06 NOTE — Assessment & Plan Note (Signed)
Given one more three-month prescription for phentermine, after this patient must be off medication for 6 months before anymore refills.

## 2013-10-07 ENCOUNTER — Other Ambulatory Visit: Payer: Self-pay | Admitting: Nurse Practitioner

## 2013-10-28 ENCOUNTER — Telehealth: Payer: Self-pay | Admitting: *Deleted

## 2013-10-28 NOTE — Telephone Encounter (Signed)
The mail order pharmacy sent out a request for clarification dated 10/14/13 for prescription Diclofenac sodium or Diclofenac potassium. We faxed it back where Dr. Romeo Apple stated the patient was to receive Diclofenac Potassium.Then I received a call from [facility]  Thursday 10/24/13 stating  they shipped her diclofenac sodium, which they stated they was advised by myself to do so. I explained to them that the only thing I changed was the quantity, due to patient needing a 3 month supply.I pulled the clarification order from our prescription stack and advised them once again that the prescription was for diclofenac potassium. I called patient to make her aware of the situation, and to let her know they were shipping out the right medication.

## 2013-10-30 ENCOUNTER — Ambulatory Visit: Payer: 59

## 2013-11-06 ENCOUNTER — Ambulatory Visit (INDEPENDENT_AMBULATORY_CARE_PROVIDER_SITE_OTHER): Payer: 59

## 2013-11-06 DIAGNOSIS — Z3042 Encounter for surveillance of injectable contraceptive: Secondary | ICD-10-CM

## 2013-11-06 DIAGNOSIS — Z3049 Encounter for surveillance of other contraceptives: Secondary | ICD-10-CM

## 2013-11-06 MED ORDER — MEDROXYPROGESTERONE ACETATE 150 MG/ML IM SUSP
150.0000 mg | Freq: Once | INTRAMUSCULAR | Status: AC
  Administered 2013-11-06: 150 mg via INTRAMUSCULAR

## 2013-11-11 ENCOUNTER — Encounter (HOSPITAL_COMMUNITY): Payer: Self-pay | Admitting: Emergency Medicine

## 2013-11-11 ENCOUNTER — Emergency Department (HOSPITAL_COMMUNITY)
Admission: EM | Admit: 2013-11-11 | Discharge: 2013-11-12 | Disposition: A | Discharge status: Home / Self Care | Payer: 59 | Attending: Emergency Medicine | Admitting: Emergency Medicine

## 2013-11-11 DIAGNOSIS — S0502XA Injury of conjunctiva and corneal abrasion without foreign body, left eye, initial encounter: Secondary | ICD-10-CM

## 2013-11-11 DIAGNOSIS — I252 Old myocardial infarction: Secondary | ICD-10-CM | POA: Insufficient documentation

## 2013-11-11 DIAGNOSIS — Y939 Activity, unspecified: Secondary | ICD-10-CM | POA: Insufficient documentation

## 2013-11-11 DIAGNOSIS — Z79899 Other long term (current) drug therapy: Secondary | ICD-10-CM | POA: Insufficient documentation

## 2013-11-11 DIAGNOSIS — S058X9A Other injuries of unspecified eye and orbit, initial encounter: Secondary | ICD-10-CM | POA: Insufficient documentation

## 2013-11-11 DIAGNOSIS — Y929 Unspecified place or not applicable: Secondary | ICD-10-CM | POA: Insufficient documentation

## 2013-11-11 DIAGNOSIS — X58XXXA Exposure to other specified factors, initial encounter: Secondary | ICD-10-CM | POA: Insufficient documentation

## 2013-11-11 MED ORDER — TETRACAINE HCL 0.5 % OP SOLN
OPHTHALMIC | Status: AC
  Administered 2013-11-11: 1 [drp]
  Filled 2013-11-11: qty 2

## 2013-11-11 MED ORDER — FLUORESCEIN SODIUM 1 MG OP STRP
ORAL_STRIP | OPHTHALMIC | Status: AC
  Administered 2013-11-12
  Filled 2013-11-11: qty 1

## 2013-11-11 NOTE — ED Notes (Signed)
Sudden onset of pain in left eye- states feels like something in it  Conjunctive red, eye watering

## 2013-11-11 NOTE — ED Provider Notes (Addendum)
CSN: [id number]     Arrival date & time 11/11/13  2328 History   First MD Initiated Contact with Patient 11/11/13 2343     Chief Complaint  Patient presents with  . Eye Pain   (Consider location/radiation/quality/duration/timing/severity/associated sxs/prior Treatment) HPI There is a 37 year old female who complains of pain in her left eye since about 3:30 this afternoon. She is not aware of any injury. She describes the pain as feeling like there is a foreign body in it. The symptoms are moderate. They're associated with redness and watery discharge. There is some mild blurred vision associated with it.  Past Medical History  Diagnosis Date  . Myocardial infarction   . Cancer pancreatic maternal grandmother  . Colon cancer maternal uncles  . Lung cancer mother  . Diabetes mellitus without complication     states this in not her - is her mom   Past Surgical History  Procedure Laterality Date  . Diagnostic laparoscopy  03/2010  . Cholecystectomy  03/2010   No family history on file. History  Substance Use Topics  . Smoking status: Never Smoker   . Smokeless tobacco: Not on file  . Alcohol Use: No   OB History   Grav Para Term Preterm Abortions TAB SAB Ect Mult Living                 Review of Systems  All other systems reviewed and are negative.    Allergies  Review of patient's allergies indicates no known allergies.  Home Medications   Current Outpatient Rx  Name  Route  Sig  Dispense  Refill  . acetaminophen-codeine (TYLENOL #3) 300-30 MG per tablet   Oral   Take 1 tablet by mouth every 6 (six) hours as needed for pain.   90 tablet   5   . diclofenac (CATAFLAM) 50 MG tablet   Oral   Take 1 tablet (50 mg total) by mouth 2 (two) times daily.   90 tablet   3   . estradiol cypionate (DEPO-ESTRADIOL) 5 MG/ML injection   Intramuscular   Inject into the muscle every 28 (twenty-eight) days.         Marland Kitchen EXPIRED: gabapentin (NEURONTIN) 100 MG capsule    Oral   Take 1 capsule (100 mg total) by mouth 3 (three) times daily.   90 capsule   2   . HYDROcodone-acetaminophen (NORCO/VICODIN) 5-325 MG per tablet   Oral   Take 1-2 tablets by mouth every 6 (six) hours as needed (for pain).   10 tablet   0   . HYDROcodone-acetaminophen (NORCO/VICODIN) 5-325 MG per tablet   Oral   Take 1-2 tablets by mouth every 6 (six) hours as needed.   6 tablet   0   . ibuprofen (ADVIL,MOTRIN) 800 MG tablet   Oral   Take 800 mg by mouth every 8 (eight) hours as needed for pain.         . phentermine (ADIPEX-P) 37.5 MG tablet   Oral   Take 1 tablet (37.5 mg total) by mouth daily before breakfast.   30 tablet   2   . ranitidine (ZANTAC) 300 MG tablet   Oral   Take 1 tablet (300 mg total) by mouth at bedtime. Prn acid reflux   30 tablet   5   . traMADol-acetaminophen (ULTRACET) 37.5-325 MG per tablet   Oral   Take 1 tablet by mouth every 4 (four) hours as needed for pain.   90 tablet  5    BP 110/77  Pulse 77  Temp(Src) 98.4 F (36.9 C)  Resp 20  Ht 5\' 9"  (1.753 m)  Wt 232 lb (105.235 kg)  BMI 34.24 kg/m2  SpO2 99%  Physical Exam General: Well-developed, well-nourished female in no acute distress; appearance consistent with age of record HENT: normocephalic; atraumatic Eyes: pupils equal, round and reactive to light; extraocular muscles intact; left conjunctival injection with watery discharge; fluorescein uptake in the inferior aspect of the left cornea; no foreign object seen Neck: supple Heart: regular rate and rhythm Lungs: clear to auscultation bilaterally Abdomen: soft; nondistended Extremities: No deformity; full range of motion Neurologic: Awake, alert and oriented; motor function intact in all extremities and symmetric; no facial droop Skin: Warm and dry Psychiatric: Normal mood and affect    ED Course  Procedures (including critical care time)  MDM      Hanley Seamen, MD 11/12/13 0005  Hanley Seamen,  MD 11/12/13 4540

## 2013-11-12 MED ORDER — HYDROCODONE-ACETAMINOPHEN 5-325 MG PO TABS: 1.0000 | ORAL_TABLET | Freq: Four times a day (QID) | ORAL | Status: DC | PRN

## 2013-11-12 MED ORDER — TOBRAMYCIN 0.3 % OP OINT
TOPICAL_OINTMENT | Freq: Four times a day (QID) | OPHTHALMIC | Status: DC
  Administered 2013-11-12: 1 via OPHTHALMIC
  Filled 2013-11-12: qty 3.5

## 2013-11-18 MED FILL — Hydrocodone-Acetaminophen Tab 5-325 MG: ORAL | Qty: 6 | Status: AC

## 2013-12-03 ENCOUNTER — Other Ambulatory Visit: Payer: Self-pay | Admitting: *Deleted

## 2013-12-03 DIAGNOSIS — M2241 Chondromalacia patellae, right knee: Secondary | ICD-10-CM

## 2013-12-03 DIAGNOSIS — M79604 Pain in right leg: Secondary | ICD-10-CM

## 2013-12-03 MED ORDER — GABAPENTIN 100 MG PO CAPS: 100.0000 mg | ORAL_CAPSULE | Freq: Three times a day (TID) | ORAL | Status: DC

## 2013-12-04 ENCOUNTER — Encounter: Payer: Self-pay | Admitting: Nurse Practitioner

## 2013-12-04 ENCOUNTER — Ambulatory Visit (INDEPENDENT_AMBULATORY_CARE_PROVIDER_SITE_OTHER): Payer: 59 | Admitting: Nurse Practitioner

## 2013-12-04 VITALS — BP 122/74 | Ht 68.5 in | Wt 234.0 lb

## 2013-12-04 DIAGNOSIS — K219 Gastro-esophageal reflux disease without esophagitis: Secondary | ICD-10-CM

## 2013-12-04 MED ORDER — PHENTERMINE HCL 37.5 MG PO TABS: 37.5000 mg | ORAL_TABLET | Freq: Every day | ORAL | Status: DC

## 2013-12-04 MED ORDER — PANTOPRAZOLE SODIUM 40 MG PO TBEC: 40.0000 mg | DELAYED_RELEASE_TABLET | Freq: Every day | ORAL | Status: DC

## 2013-12-07 ENCOUNTER — Encounter: Payer: Self-pay | Admitting: Nurse Practitioner

## 2013-12-07 DIAGNOSIS — K219 Gastro-esophageal reflux disease without esophagitis: Secondary | ICD-10-CM | POA: Insufficient documentation

## 2013-12-07 NOTE — Assessment & Plan Note (Signed)
.   pantoprazole (PROTONIX) 40 MG tablet    Sig: Take 1 tablet (40 mg total) by mouth daily.    Dispense:  30 tablet    Refill:  2    Order Specific Question:  Supervising Provider    Answer:  Merlyn Albert [[id number]]  . phentermine (ADIPEX-P) 37.5 MG tablet    Sig: Take 1 tablet (37.5 mg total) by mouth daily before breakfast.    Dispense:  30 tablet    Refill:  2    Order Specific Question:  Supervising Provider    Answer:  Merlyn Albert [[id number]]   Wean off of caffeine. Reviewed dietary measures. Diclofenac is controlling her pain very well,  stop this only if her lifestyle changes do not work. If symptoms have resolved in the next few weeks, switch back to Zantac. Call back if persists.

## 2013-12-07 NOTE — Progress Notes (Signed)
Subjective:  Presents complaints of an increase in her acid reflux symptoms for the past 2 weeks. Takes Zantac 300 mg at nighttime, not controlling her symptoms. Has increased her caffeine use. Eating a lot of chocolate. No fever. No abdominal pain. No change in her bowel movements. Continues to have IBS symptoms, including alternating cycles of diarrhea or constipation. No change in the color of her stools or blood in her stool. Takes diclofenac twice a day for orthopedic issues. Rare social alcohol. Denies any tobacco use.  Objective:   BP 122/74  Ht 5' 8.5" (1.74 m)  Wt 234 lb (106.142 kg)  BMI 35.06 kg/m2 NAD. Alert, oriented. Lungs clear. Heart regular rate rhythm. Abdomen soft nondistended with active bowel sounds; no tenderness or masses noted.  Assessment:GERD (gastroesophageal reflux disease)  Morbid obesity Plan: Hold on Zantac. Switch to: Meds ordered this encounter  Medications  . DISCONTD: diclofenac (VOLTAREN) 50 MG EC tablet    Sig:   . pantoprazole (PROTONIX) 40 MG tablet    Sig: Take 1 tablet (40 mg total) by mouth daily.    Dispense:  30 tablet    Refill:  2    Order Specific Question:  Supervising Provider    Answer:  Merlyn Albert [[id number]]  . phentermine (ADIPEX-P) 37.5 MG tablet    Sig: Take 1 tablet (37.5 mg total) by mouth daily before breakfast.    Dispense:  30 tablet    Refill:  2    Order Specific Question:  Supervising Provider    Answer:  Merlyn Albert [[id number]]   this is patient's last refill on phentermine, must be off medicine for at least 6 months. Wean off of caffeine. Reviewed dietary measures. Continue weight loss efforts. Diclofenac is controlling her pain very well, stop this only if her lifestyle changes do not work. If symptoms have resolved in the next few weeks, switch back to Zantac. Call back if persists.

## 2013-12-18 ENCOUNTER — Other Ambulatory Visit: Payer: Self-pay | Admitting: *Deleted

## 2013-12-18 DIAGNOSIS — M79604 Pain in right leg: Secondary | ICD-10-CM

## 2013-12-18 MED ORDER — GABAPENTIN 100 MG PO CAPS: 100.0000 mg | ORAL_CAPSULE | Freq: Three times a day (TID) | ORAL | Status: DC

## 2014-01-22 ENCOUNTER — Ambulatory Visit: Payer: 59

## 2014-01-31 ENCOUNTER — Ambulatory Visit: Payer: 59

## 2014-02-11 ENCOUNTER — Encounter (HOSPITAL_COMMUNITY): Payer: Self-pay | Admitting: Emergency Medicine

## 2014-02-11 ENCOUNTER — Emergency Department (HOSPITAL_COMMUNITY)
Admission: EM | Admit: 2014-02-11 | Discharge: 2014-02-11 | Disposition: A | Discharge status: Home / Self Care | Payer: 59 | Attending: Emergency Medicine | Admitting: Emergency Medicine

## 2014-02-11 ENCOUNTER — Emergency Department (HOSPITAL_COMMUNITY): Payer: 59

## 2014-02-11 DIAGNOSIS — J3489 Other specified disorders of nose and nasal sinuses: Secondary | ICD-10-CM | POA: Insufficient documentation

## 2014-02-11 DIAGNOSIS — Z791 Long term (current) use of non-steroidal anti-inflammatories (NSAID): Secondary | ICD-10-CM | POA: Insufficient documentation

## 2014-02-11 DIAGNOSIS — J40 Bronchitis, not specified as acute or chronic: Secondary | ICD-10-CM | POA: Insufficient documentation

## 2014-02-11 DIAGNOSIS — Z85038 Personal history of other malignant neoplasm of large intestine: Secondary | ICD-10-CM | POA: Insufficient documentation

## 2014-02-11 DIAGNOSIS — I252 Old myocardial infarction: Secondary | ICD-10-CM | POA: Insufficient documentation

## 2014-02-11 DIAGNOSIS — Z79899 Other long term (current) drug therapy: Secondary | ICD-10-CM | POA: Insufficient documentation

## 2014-02-11 DIAGNOSIS — R0982 Postnasal drip: Secondary | ICD-10-CM | POA: Insufficient documentation

## 2014-02-11 DIAGNOSIS — E119 Type 2 diabetes mellitus without complications: Secondary | ICD-10-CM | POA: Insufficient documentation

## 2014-02-11 DIAGNOSIS — Z85118 Personal history of other malignant neoplasm of bronchus and lung: Secondary | ICD-10-CM | POA: Insufficient documentation

## 2014-02-11 HISTORY — DX: Unspecified osteoarthritis, unspecified site: M19.90

## 2014-02-11 MED ORDER — CETIRIZINE HCL 10 MG PO TABS: ORAL_TABLET | ORAL | Status: DC

## 2014-02-11 MED ORDER — PROMETHAZINE-CODEINE 6.25-10 MG/5ML PO SYRP: 5.0000 mL | ORAL_SOLUTION | ORAL | Status: DC | PRN

## 2014-02-11 MED ORDER — ALBUTEROL SULFATE HFA 108 (90 BASE) MCG/ACT IN AERS
2.0000 | INHALATION_SPRAY | Freq: Once | RESPIRATORY_TRACT | Status: AC
  Administered 2014-02-11: 2 via RESPIRATORY_TRACT
  Filled 2014-02-11: qty 6.7

## 2014-02-11 MED ORDER — BENZONATATE 200 MG PO CAPS: 200.0000 mg | ORAL_CAPSULE | Freq: Three times a day (TID) | ORAL | Status: DC | PRN

## 2014-02-11 MED ORDER — HYDROCOD POLST-CHLORPHEN POLST 10-8 MG/5ML PO LQCR: 5.0000 mL | Freq: Two times a day (BID) | ORAL | Status: DC | PRN

## 2014-02-11 MED ORDER — BENZONATATE 100 MG PO CAPS
200.0000 mg | ORAL_CAPSULE | Freq: Once | ORAL | Status: AC
  Administered 2014-02-11: 200 mg via ORAL
  Filled 2014-02-11: qty 2

## 2014-02-11 NOTE — Discharge Instructions (Signed)
Bronchitis Bronchitis is inflammation of the airways that extend from the windpipe into the lungs (bronchi). The inflammation often causes mucus to develop, which leads to a cough. If the inflammation becomes severe, it may cause shortness of breath. CAUSES  Bronchitis may be caused by:   Viral infections.   Bacteria.   Cigarette smoke.   Allergens, pollutants, and other irritants.  SIGNS AND SYMPTOMS  The most common symptom of bronchitis is a frequent cough that produces mucus. Other symptoms include:  Fever.   Body aches.   Chest congestion.   Chills.   Shortness of breath.   Sore throat.  DIAGNOSIS  Bronchitis is usually diagnosed through a medical history and physical exam. Tests, such as chest X-rays, are sometimes done to rule out other conditions.  TREATMENT  You may need to avoid contact with whatever caused the problem (smoking, for example). Medicines are sometimes needed. These may include:  Antibiotics. These may be prescribed if the condition is caused by bacteria.  Cough suppressants. These may be prescribed for relief of cough symptoms.   Inhaled medicines. These may be prescribed to help open your airways and make it easier for you to breathe.   Steroid medicines. These may be prescribed for those with recurrent (chronic) bronchitis. HOME CARE INSTRUCTIONS  Get plenty of rest.   Drink enough fluids to keep your urine clear or pale yellow (unless you have a medical condition that requires fluid restriction). Increasing fluids may help thin your secretions and will prevent dehydration.   Only take over-the-counter or prescription medicines as directed by your health care provider.  Only take antibiotics as directed. Make sure you finish them even if you start to feel better.  Avoid secondhand smoke, irritating chemicals, and strong fumes. These will make bronchitis worse. If you are a smoker, quit smoking. Consider using nicotine gum or  skin patches to help control withdrawal symptoms. Quitting smoking will help your lungs heal faster.   Put a cool-mist humidifier in your bedroom at night to moisten the air. This may help loosen mucus. Change the water in the humidifier daily. You can also run the hot water in your shower and sit in the bathroom with the door closed for 5 10 minutes.   Follow up with your health care provider as directed.   Wash your hands frequently to avoid catching bronchitis again or spreading an infection to others.  SEEK MEDICAL CARE IF: Your symptoms do not improve after 1 week of treatment.  SEEK IMMEDIATE MEDICAL CARE IF:  Your fever increases.  You have chills.   You have chest pain.   You have worsening shortness of breath.   You have bloody sputum.  You faint.  You have lightheadedness.  You have a severe headache.   You vomit repeatedly. MAKE SURE YOU:   Understand these instructions.  Will watch your condition.  Will get help right away if you are not doing well or get worse. Document Released: 12/05/2005 Document Revised: 09/25/2013 Document Reviewed: 07/30/2013 [facility] Patient Information 2014 [place], [place].  Take the medicines prescribed,  Using caution with the cough syrup - this will make you sleepy - do not drive within 4 hours of taking this medicine.  Use the inhaler given - 2 puffs every 4 hours if you are coughing, wheezing or short of breath.  Your chest xray is normal today.  Rest, make sure you are drinking plenty of fluids.  A teaspoon of honey can also help with cough suppression.

## 2014-02-11 NOTE — ED Provider Notes (Signed)
Medical screening examination/treatment/procedure(s) were performed by non-physician practitioner and as supervising physician I was immediately available for consultation/collaboration.  EKG Interpretation   None         Glynn OctaveStephen Ottie Neglia, MD 02/11/14 2125

## 2014-02-11 NOTE — ED Notes (Signed)
Cough ongoing for 3 weeks

## 2014-02-11 NOTE — ED Provider Notes (Signed)
CSN: [id number]     Arrival date & time 02/11/14  1946 History   First MD Initiated Contact with Patient 02/11/14 2004     Chief Complaint  Patient presents with  . Cough     (Consider location/radiation/quality/duration/timing/severity/associated sxs/prior Treatment) HPI Comments: Stacie Roberts is a 38 y.o. Female with a 3 week history of uri symptoms which included sore throat, nasal congestion and clear rhinorrhea with cough.  Her sore throat and nasal congestion has resolved but she continues to have near constant coughing triggered by a throat tickle and continues to have clear post nasal drip.  She denies fevers, headache, chills, chest pain and shortness of breath at baseline, but does have occasional wheezing, mostly at night.  She has taken cough drops, otc cough syrup (tussin) without relief of symptoms.   The history is provided by the patient.    Past Medical History  Diagnosis Date  . Myocardial infarction   . Cancer pancreatic maternal grandmother  . Colon cancer maternal uncles  . Lung cancer mother  . Diabetes mellitus without complication     states this in not her - is her mom   Past Surgical History  Procedure Laterality Date  . Diagnostic laparoscopy  03/2010  . Cholecystectomy  03/2010  . Colonoscopyi     History reviewed. No pertinent family history. History  Substance Use Topics  . Smoking status: Never Smoker   . Smokeless tobacco: Not on file  . Alcohol Use: No   OB History   Grav Para Term Preterm Abortions TAB SAB Ect Mult Living                 Review of Systems  Constitutional: Negative for fever and chills.  HENT: Positive for postnasal drip and rhinorrhea. Negative for congestion, ear pain, sinus pressure, sore throat, trouble swallowing and voice change.   Eyes: Negative for discharge.  Respiratory: Positive for cough. Negative for shortness of breath, wheezing and stridor.   Cardiovascular: Negative for chest pain.   Gastrointestinal: Negative for abdominal pain.  Genitourinary: Negative.       Allergies  Review of patient's allergies indicates no known allergies.  Home Medications   Current Outpatient Rx  Name  Route  Sig  Dispense  Refill  . diclofenac (CATAFLAM) 50 MG tablet   Oral   Take 1 tablet (50 mg total) by mouth 2 (two) times daily.   90 tablet   3   . gabapentin (NEURONTIN) 100 MG capsule   Oral   Take 1 capsule (100 mg total) by mouth 3 (three) times daily.   90 capsule   2   . medroxyPROGESTERone (DEPO-PROVERA) 150 MG/ML injection   Intramuscular   Inject 150 mg into the muscle every 3 (three) months.         . pantoprazole (PROTONIX) 40 MG tablet   Oral   Take 1 tablet (40 mg total) by mouth daily.   30 tablet   2   . phentermine (ADIPEX-P) 37.5 MG tablet   Oral   Take 1 tablet (37.5 mg total) by mouth daily before breakfast.   30 tablet   2   . benzonatate (TESSALON) 200 MG capsule   Oral   Take 1 capsule (200 mg total) by mouth 3 (three) times daily as needed for cough.   21 capsule   0   . chlorpheniramine-HYDROcodone (TUSSIONEX PENNKINETIC ER) 10-8 MG/5ML LQCR   Oral   Take 5 mLs by mouth every 12 (  twelve) hours as needed for cough.   75 mL   0    BP 118/69  Pulse 97  Temp(Src) 98.5 F (36.9 C) (Oral)  Resp 20  Ht 5\' 9"  (1.753 m)  Wt 240 lb (108.863 kg)  BMI 35.43 kg/m2  SpO2 100%  LMP 02/11/2014 Physical Exam  Nursing note and vitals reviewed. Constitutional: She appears well-developed and well-nourished.  HENT:  Head: Normocephalic and atraumatic.  Eyes: Conjunctivae are normal.  Neck: Normal range of motion.  Cardiovascular: Normal rate, regular rhythm, normal heart sounds and intact distal pulses.   Pulmonary/Chest: Effort normal and breath sounds normal. No respiratory distress. She has no wheezes. She has no rales. She exhibits no tenderness.  Frequent dry cough.  Abdominal: Soft. Bowel sounds are normal. There is no  tenderness.  Musculoskeletal: Normal range of motion.  Neurological: She is alert.  Skin: Skin is warm and dry.  Psychiatric: She has a normal mood and affect.    ED Course  Procedures (including critical care time) Labs Review Labs Reviewed - No data to display Imaging Review Dg Chest 2 View  02/11/2014   CLINICAL DATA:  38 year old female with cough and congestion. Shortness of breath.  EXAM: CHEST  2 VIEW  COMPARISON:  04/23/2011 chest radiograph  FINDINGS: The cardiomediastinal silhouette is unremarkable.  There is no evidence of focal airspace disease, pulmonary edema, suspicious pulmonary nodule/mass, pleural effusion, or pneumothorax. No acute bony abnormalities are identified.  IMPRESSION: No active cardiopulmonary disease.   Electronically Signed   By: Laveda Abbe M.D.   On: 02/11/2014 20:47    EKG Interpretation   None       MDM   Final diagnoses:  Bronchitis    Patients labs and/or radiological studies were viewed and considered during the medical decision making and disposition process. Pt was given albuterol mdi with spacer.  Also given trial of tessalon with slight improvement in cough episodes,  Prescribed this along with phenergan/codeine cough syrup.  Also prescribed zyrtec for nasal discharge.  Encouraged increased fluid intake, cough lozenges, honey, recheck by pcp if not improving over the next week.    The patient appears reasonably screened and/or stabilized for discharge and I doubt any other medical condition or other [facility] requiring further screening, evaluation, or treatment in the ED at this time prior to discharge.     Burgess Amor, PA-C 02/11/14 2113

## 2014-02-11 NOTE — ED Notes (Signed)
Cough, for 3 weeks.  Runny nose.  No NVD

## 2014-02-19 ENCOUNTER — Encounter: Payer: Self-pay | Admitting: Nurse Practitioner

## 2014-02-19 ENCOUNTER — Ambulatory Visit (INDEPENDENT_AMBULATORY_CARE_PROVIDER_SITE_OTHER): Payer: Self-pay | Admitting: Nurse Practitioner

## 2014-02-19 ENCOUNTER — Encounter: Payer: Self-pay | Admitting: Family Medicine

## 2014-02-19 VITALS — BP 128/82 | Temp 98.6°F | Ht 68.5 in | Wt 242.2 lb

## 2014-02-19 DIAGNOSIS — J209 Acute bronchitis, unspecified: Secondary | ICD-10-CM

## 2014-02-19 DIAGNOSIS — J069 Acute upper respiratory infection, unspecified: Secondary | ICD-10-CM

## 2014-02-19 MED ORDER — PREDNISONE 20 MG PO TABS: ORAL_TABLET | ORAL | Status: DC

## 2014-02-19 MED ORDER — CIPROFLOXACIN HCL 500 MG PO TABS: 500.0000 mg | ORAL_TABLET | Freq: Two times a day (BID) | ORAL | Status: DC

## 2014-02-19 MED ORDER — HYDROCODONE-HOMATROPINE 5-1.5 MG/5ML PO SYRP: 5.0000 mL | ORAL_SOLUTION | ORAL | Status: DC | PRN

## 2014-02-19 MED ORDER — BENZONATATE 100 MG PO CAPS: 100.0000 mg | ORAL_CAPSULE | Freq: Three times a day (TID) | ORAL | Status: DC | PRN

## 2014-02-19 NOTE — Progress Notes (Signed)
Subjective:  Presents complaints of persistent cough over the past month. Was seen at ED on 2/24, diagnosed with bronchitis. Was not given an antibiotic. Given Phenergan with codeine syrup and an albuterol inhaler. Continues to have head congestion frequent nonproductive cough which is unchanged. Will occasionally producing yellow to green mucus. Slight headache. No ear pain. No sore throat. Minimal wheezing. Slight tightness only with prolonged cough. Nonsmoker. No acid reflux.  Objective:   BP 128/82  Temp(Src) 98.6 F (37 C) (Oral)  Ht 5' 8.5" (1.74 m)  Wt 242 lb 3.2 oz (109.861 kg)  BMI 36.29 kg/m2  LMP 02/11/2014 NAD. Alert, oriented. TMs significant cloudy effusion, no erythema. Pharynx injected with slightly green PND noted. Neck supple with mild soft anterior adenopathy. Lungs occasional faint expiratory crackles, no wheezing or tachypnea. Very frequent nonproductive cough. Heart regular rate rhythm.  Assessment:Acute upper respiratory infections of unspecified site  Acute bronchitis  Plan: Meds ordered this encounter  Medications  . predniSONE (DELTASONE) 20 MG tablet    Sig: 3 po qd x 3 d then 2 po qd x 3 d then 1 po qd x 3 d    Dispense:  18 tablet    Refill:  0    Order Specific Question:  Supervising Provider    Answer:  Merlyn AlbertLUKING, WILLIAM S [[id number]]  . benzonatate (TESSALON) 100 MG capsule    Sig: Take 1 capsule (100 mg total) by mouth 3 (three) times daily as needed for cough.    Dispense:  42 capsule    Refill:  0    Order Specific Question:  Supervising Provider    Answer:  Merlyn AlbertLUKING, WILLIAM S [[id number]]  . HYDROcodone-homatropine (HYCODAN) 5-1.5 MG/5ML syrup    Sig: Take 5 mLs by mouth every 4 (four) hours as needed.    Dispense:  120 mL    Refill:  0    Order Specific Question:  Supervising Provider    Answer:  Merlyn AlbertLUKING, WILLIAM S [[id number]]  . ciprofloxacin (CIPRO) 500 MG tablet    Sig: Take 1 tablet (500 mg total) by mouth 2 (two) times daily.    Dispense:  20 tablet   Refill:  0    Order Specific Question:  Supervising Provider    Answer:  Merlyn AlbertLUKING, WILLIAM S [[id number]]   Patient is uninsured at this point. Her insurance will take effect in a few weeks. Call back in 7-10 days if no improvement, sooner if worse.

## 2014-04-06 ENCOUNTER — Other Ambulatory Visit: Payer: Self-pay | Admitting: Nurse Practitioner

## 2014-04-07 ENCOUNTER — Other Ambulatory Visit: Payer: Self-pay | Admitting: *Deleted

## 2014-04-07 ENCOUNTER — Telehealth: Payer: Self-pay | Admitting: Family Medicine

## 2014-04-07 DIAGNOSIS — M2242 Chondromalacia patellae, left knee: Principal | ICD-10-CM

## 2014-04-07 DIAGNOSIS — M2241 Chondromalacia patellae, right knee: Secondary | ICD-10-CM

## 2014-04-07 MED ORDER — ACETAMINOPHEN-CODEINE #3 300-30 MG PO TABS: 1.0000 | ORAL_TABLET | Freq: Four times a day (QID) | ORAL | Status: DC | PRN

## 2014-04-07 NOTE — Telephone Encounter (Signed)
NTC- see message, rec keep ov Friday, could be meds, could be something else, bring meds with her

## 2014-04-07 NOTE — Telephone Encounter (Signed)
Notified patient rec keep ov Friday, could be meds, could be something else, bring meds with her. Patient verbalized understanding.

## 2014-04-07 NOTE — Telephone Encounter (Signed)
Patient noticed on Saturday morning that both her feet and ankles started to swell and have been swollen since. She has an appointment on Friday with Eber Jonesarolyn. Is this something that can wait until then?   [facility]

## 2014-04-11 ENCOUNTER — Ambulatory Visit (INDEPENDENT_AMBULATORY_CARE_PROVIDER_SITE_OTHER): Payer: BC Managed Care – PPO | Admitting: Nurse Practitioner

## 2014-04-11 ENCOUNTER — Encounter: Payer: Self-pay | Admitting: Nurse Practitioner

## 2014-04-11 ENCOUNTER — Other Ambulatory Visit: Payer: Self-pay | Admitting: Nurse Practitioner

## 2014-04-11 VITALS — BP 108/70 | Ht 68.5 in | Wt 256.5 lb

## 2014-04-11 DIAGNOSIS — R5383 Other fatigue: Principal | ICD-10-CM

## 2014-04-11 DIAGNOSIS — Z309 Encounter for contraceptive management, unspecified: Secondary | ICD-10-CM

## 2014-04-11 DIAGNOSIS — K219 Gastro-esophageal reflux disease without esophagitis: Secondary | ICD-10-CM

## 2014-04-11 DIAGNOSIS — R748 Abnormal levels of other serum enzymes: Secondary | ICD-10-CM

## 2014-04-11 DIAGNOSIS — R5381 Other malaise: Secondary | ICD-10-CM

## 2014-04-11 DIAGNOSIS — R609 Edema, unspecified: Secondary | ICD-10-CM

## 2014-04-11 LAB — CBC WITH DIFFERENTIAL/PLATELET
Basophils Absolute: 0 10*3/uL (ref 0.0–0.1)
Basophils Relative: 0 % (ref 0–1)
Eosinophils Absolute: 0 10*3/uL (ref 0.0–0.7)
Eosinophils Relative: 0 % (ref 0–5)
HEMATOCRIT: 40.2 % (ref 36.0–46.0)
HEMOGLOBIN: 13.9 g/dL (ref 12.0–15.0)
LYMPHS ABS: 3.2 10*3/uL (ref 0.7–4.0)
LYMPHS PCT: 28 % (ref 12–46)
MCH: 31.3 pg (ref 26.0–34.0)
MCHC: 34.6 g/dL (ref 30.0–36.0)
MCV: 90.5 fL (ref 78.0–100.0)
MONOS PCT: 8 % (ref 3–12)
Monocytes Absolute: 0.9 10*3/uL (ref 0.1–1.0)
Neutro Abs: 7.4 10*3/uL (ref 1.7–7.7)
Neutrophils Relative %: 64 % (ref 43–77)
PLATELETS: 285 10*3/uL (ref 150–400)
RBC: 4.44 MIL/uL (ref 3.87–5.11)
RDW: 13 % (ref 11.5–15.5)
WBC: 11.5 10*3/uL — AB (ref 4.0–10.5)

## 2014-04-11 LAB — POCT URINE PREGNANCY: Preg Test, Ur: NEGATIVE

## 2014-04-11 MED ORDER — RANITIDINE HCL 300 MG PO TABS: 300.0000 mg | ORAL_TABLET | Freq: Every day | ORAL | Status: DC

## 2014-04-11 MED ORDER — MEDROXYPROGESTERONE ACETATE 150 MG/ML IM SUSP
150.0000 mg | Freq: Once | INTRAMUSCULAR | Status: AC
  Administered 2014-04-11: 150 mg via INTRAMUSCULAR

## 2014-04-12 LAB — BRAIN NATRIURETIC PEPTIDE: Brain Natriuretic Peptide: 23.4 pg/mL (ref 0.0–100.0)

## 2014-04-12 LAB — HEPATIC FUNCTION PANEL
ALK PHOS: 81 U/L (ref 39–117)
ALT: 73 U/L — ABNORMAL HIGH (ref 0–35)
AST: 40 U/L — ABNORMAL HIGH (ref 0–37)
Albumin: 4.1 g/dL (ref 3.5–5.2)
BILIRUBIN TOTAL: 0.9 mg/dL (ref 0.2–1.2)
Bilirubin, Direct: 0.2 mg/dL (ref 0.0–0.3)
Indirect Bilirubin: 0.7 mg/dL (ref 0.2–1.2)
TOTAL PROTEIN: 6.7 g/dL (ref 6.0–8.3)

## 2014-04-12 LAB — BASIC METABOLIC PANEL
BUN: 10 mg/dL (ref 6–23)
CO2: 29 mEq/L (ref 19–32)
Calcium: 9.3 mg/dL (ref 8.4–10.5)
Chloride: 103 mEq/L (ref 96–112)
Creat: 0.72 mg/dL (ref 0.50–1.10)
Glucose, Bld: 88 mg/dL (ref 70–99)
POTASSIUM: 4.1 meq/L (ref 3.5–5.3)
SODIUM: 142 meq/L (ref 135–145)

## 2014-04-14 ENCOUNTER — Encounter: Payer: Self-pay | Admitting: Nurse Practitioner

## 2014-04-14 NOTE — Progress Notes (Signed)
Subjective:  Presents to discuss her birth control. Would like to restart her Depo-Provera. Her last injection was due in February, has not had a menstrual cycle. Has had unprotected sex. At least 2 weeks since last time she had intercourse. Has gained 10 pounds and she's been off the Depo-Provera. Mild fatigue especially with weight gain. Having some swelling in the ankles and feet over the past 6 days. No change in sodium intake. No changes in her medication. OTC meds or supplements. No chest pain unusual shortness of breath orthopnea.has been off her Protonix for about 2 months. Has had a slight flare up of her acid reflux for about a week. Restart her Protonix a couple of days ago, only slight improvement. No abdominal pain. No diarrhea orconstipation. No blood in her stool.no recent changes to her Neurontin dose.  Objective:   BP 108/70  Ht 5' 8.5" (1.74 m)  Wt 256 lb 8 oz (116.348 kg)  BMI 38.43 kg/m2 NAD. Alert, oriented. Lungs clear. Heart regular rate rhythm.abdomen soft nondistended nontender. No JVD. Lower extremities 1+ pitting edema. Patient has a picture on her phone of edema initially, more significant than what is on exam at this point. urine hCG negative.  Assessment: Other malaise and fatigue - Plan: Basic metabolic panel, CBC with Differential, Hepatic function panel, Brain natriuretic peptide  Contraception management - Plan: POCT urine pregnancy, medroxyPROGESTERone (DEPO-PROVERA) injection 150 mg  Peripheral edema - Plan: Basic metabolic panel, CBC with Differential, Hepatic function panel, Brain natriuretic peptide  GERD (gastroesophageal reflux disease)  Plan: Meds ordered this encounter  Medications  . gabapentin (NEURONTIN) 100 MG capsule    Sig: Take 100 mg by mouth 3 (three) times daily as needed.  Marland Kitchen. DISCONTD: ranitidine (ZANTAC) 300 MG tablet    Sig: Take 1 tablet (300 mg total) by mouth at bedtime.    Dispense:  30 tablet    Refill:  5    Order Specific  Question:  Supervising Provider    Answer:  Merlyn AlbertLUKING, WILLIAM S [[id number]]  . medroxyPROGESTERone (DEPO-PROVERA) injection 150 mg    Sig:    Patient is interested in e sure/Nexplanon. Will refer to GYN for insertion. Encourage weight loss, low sodium diet.lab work pending. Further followup based on labs, patient to call back sooner if any problems.

## 2014-04-15 ENCOUNTER — Ambulatory Visit (INDEPENDENT_AMBULATORY_CARE_PROVIDER_SITE_OTHER): Payer: BC Managed Care – PPO | Admitting: Orthopedic Surgery

## 2014-04-15 ENCOUNTER — Encounter: Payer: Self-pay | Admitting: Orthopedic Surgery

## 2014-04-15 VITALS — BP 117/83 | Ht 68.5 in | Wt 256.0 lb

## 2014-04-15 DIAGNOSIS — M2240 Chondromalacia patellae, unspecified knee: Secondary | ICD-10-CM

## 2014-04-15 DIAGNOSIS — M2241 Chondromalacia patellae, right knee: Secondary | ICD-10-CM

## 2014-04-15 DIAGNOSIS — M2242 Chondromalacia patellae, left knee: Principal | ICD-10-CM

## 2014-04-15 MED ORDER — HYDROCODONE-ACETAMINOPHEN 5-325 MG PO TABS: 1.0000 | ORAL_TABLET | Freq: Four times a day (QID) | ORAL | Status: AC | PRN

## 2014-04-15 NOTE — Patient Instructions (Addendum)
Pending orthovisc (Run benefits for Orthovisc)

## 2014-04-15 NOTE — Progress Notes (Signed)
Patient ID: Stacie Roberts, female   DOB: Jan 22, 1976, 38 y.o.   MRN: [id number]  Chief Complaint  Patient presents with  . Knee Pain    Bilateral knee pain left > right    38 year old female with chronic bilateral anterior knee pain previous to treat with Ultracet anti-inflammatories physical therapy and injection. She presents now with increased pain on the left as well as pain on the right. She has classic symptoms of anterior knee pain while sitting painful stair navigation  Previous MRI has been performed there is no meniscal tear. Previous x-rays show normal tibiofemoral articulations. Patello-femoral joint centered.  System review negative  Physical Exam(6) GENERAL: normal development   CDV: pulses are normal   Skin: normal  Psychiatric: awake, alert and oriented  Neuro: normal sensation  MSK Gait:  Remains normal. Range of motion of the knee remains normal she continues with crepitance bilaterally. Knees are stable meniscal signs are negative strength is normal scans intact  Encounter Diagnosis  Name Primary?  . Chondromalacia of both patellae Yes    Meds ordered this encounter  Medications  . HYDROcodone-acetaminophen (NORCO/VICODIN) 5-325 MG per tablet    Sig: Take 1 tablet by mouth every 6 (six) hours as needed for moderate pain.    Dispense:  56 tablet    Refill:  0    Continue diclofenac twice a day  Order Orthovisc for left knee

## 2014-04-18 NOTE — Addendum Note (Signed)
Addended byOneal Deputy: Dodge Ator D on: 04/18/2014 11:16 AM   Modules accepted: Orders

## 2014-04-18 NOTE — Progress Notes (Signed)
Patient notified and verbalized understanding of the test results. No further questions. 

## 2014-04-25 ENCOUNTER — Ambulatory Visit (HOSPITAL_COMMUNITY)
Admission: RE | Admit: 2014-04-25 | Discharge: 2014-04-25 | Disposition: A | Discharge status: Home / Self Care | Payer: BC Managed Care – PPO | Source: Ambulatory Visit | Attending: Nurse Practitioner | Admitting: Nurse Practitioner

## 2014-04-25 DIAGNOSIS — R7989 Other specified abnormal findings of blood chemistry: Secondary | ICD-10-CM | POA: Diagnosis not present

## 2014-04-25 DIAGNOSIS — Z9089 Acquired absence of other organs: Secondary | ICD-10-CM | POA: Insufficient documentation

## 2014-04-25 LAB — HEPATITIS C ANTIBODY: HCV AB: NEGATIVE

## 2014-04-25 LAB — HEPATITIS B SURFACE ANTIGEN: HEP B S AG: NEGATIVE

## 2014-04-25 LAB — FERRITIN: Ferritin: 143 ng/mL (ref 10–291)

## 2014-05-01 ENCOUNTER — Other Ambulatory Visit: Payer: Self-pay | Admitting: Nurse Practitioner

## 2014-05-01 ENCOUNTER — Telehealth: Payer: Self-pay | Admitting: Family Medicine

## 2014-05-01 ENCOUNTER — Ambulatory Visit (INDEPENDENT_AMBULATORY_CARE_PROVIDER_SITE_OTHER): Payer: BC Managed Care – PPO | Admitting: Orthopedic Surgery

## 2014-05-01 VITALS — BP 130/78 | Ht 68.5 in | Wt 256.0 lb

## 2014-05-01 DIAGNOSIS — M2241 Chondromalacia patellae, right knee: Secondary | ICD-10-CM

## 2014-05-01 DIAGNOSIS — M2242 Chondromalacia patellae, left knee: Principal | ICD-10-CM

## 2014-05-01 DIAGNOSIS — M2240 Chondromalacia patellae, unspecified knee: Secondary | ICD-10-CM

## 2014-05-01 MED ORDER — HYDROCHLOROTHIAZIDE 25 MG PO TABS: ORAL_TABLET | ORAL | Status: DC

## 2014-05-01 NOTE — Progress Notes (Signed)
Patient ID: Stacie GripKristie D Roberts, female   DOB: December 07, 1976, 38 y.o.   MRN: [id number] Chief Complaint  Patient presents with  . Follow-up    Orthovisc injection  Left Knee # 1 of 3     Orthovisc injection  Preop diagnosis osteoarthritis left knee  Postoperative diagnosis same  Procedure Orthovisc injection  Verbal consent was obtained. Timeout was completed.  The knee was prepped with alcohol and ethyl chloride. A 20-gauge needle was used to inject 1 bile of Orthovisc into the joint via lateral approach  There were no complications the wound was sterilely dressed.   1 week

## 2014-05-01 NOTE — Telephone Encounter (Signed)
This could be a medication side effect (possibly Neurontin) or related to weight gain. Will send in for fluid pills. Call back in 2 weeks if no improvement. Remind her about repeat labs due in a few weeks.

## 2014-05-01 NOTE — Telephone Encounter (Signed)
Discussed with patient

## 2014-05-01 NOTE — Telephone Encounter (Signed)
Pt states that her feet are still swelling at this point even after all the testing Has come back normal.  Wants to know if we can call her in something for this Swelling, possibly water pills?   [facility]

## 2014-05-08 ENCOUNTER — Ambulatory Visit (INDEPENDENT_AMBULATORY_CARE_PROVIDER_SITE_OTHER): Payer: BC Managed Care – PPO | Admitting: Orthopedic Surgery

## 2014-05-08 ENCOUNTER — Encounter: Payer: Self-pay | Admitting: Orthopedic Surgery

## 2014-05-08 VITALS — BP 117/76

## 2014-05-08 DIAGNOSIS — M2240 Chondromalacia patellae, unspecified knee: Secondary | ICD-10-CM

## 2014-05-08 DIAGNOSIS — M2242 Chondromalacia patellae, left knee: Principal | ICD-10-CM

## 2014-05-08 DIAGNOSIS — M2241 Chondromalacia patellae, right knee: Secondary | ICD-10-CM

## 2014-05-08 NOTE — Patient Instructions (Signed)

## 2014-05-08 NOTE — Progress Notes (Signed)
Patient ID: Stacie Roberts, female   DOB: [birth year], 38 y.o.   MRN: [id number] Second Orthovisc injection left knee  No complications from the first injection. Patient reports improvement with less pain   Orthovisc injection  Preop diagnosis osteoarthritis left knee  Postoperative diagnosis same  Procedure Orthovisc injection  Verbal consent was obtained. Timeout was completed.  The knee was prepped with alcohol and ethyl chloride. A 20-gauge needle was used to inject 1 bile of Orthovisc into the joint via lateral approach  There were no complications the wound was sterilely dressed.   Followup in one week for left knee injection

## 2014-05-19 ENCOUNTER — Encounter: Payer: Self-pay | Admitting: Orthopedic Surgery

## 2014-05-19 ENCOUNTER — Ambulatory Visit (INDEPENDENT_AMBULATORY_CARE_PROVIDER_SITE_OTHER): Payer: BC Managed Care – PPO | Admitting: Orthopedic Surgery

## 2014-05-19 VITALS — BP 120/78 | Ht 68.5 in | Wt 256.0 lb

## 2014-05-19 DIAGNOSIS — M2242 Chondromalacia patellae, left knee: Principal | ICD-10-CM

## 2014-05-19 DIAGNOSIS — M2241 Chondromalacia patellae, right knee: Secondary | ICD-10-CM

## 2014-05-19 DIAGNOSIS — M2240 Chondromalacia patellae, unspecified knee: Secondary | ICD-10-CM

## 2014-05-19 NOTE — Progress Notes (Signed)
Patient ID: Stacie Roberts, female   DOB: 12/20/1975, 38 y.o.   MRN: [id number]  Chief Complaint  Patient presents with  . Follow-up    Orthovisc injection #3 of 3 Left knee   BP 120/78  Ht 5' 8.5" (1.74 m)  Wt 256 lb (116.121 kg)  BMI 38.35 kg/m2  Encounter Diagnoses  Name Primary?  . Chondromalacia of both patellae Yes  . Chondromalacia of patella     Orthovisc injection  Preop diagnosis osteoarthritis left knee  Postoperative diagnosis same  Procedure Orthovisc injection  Verbal consent was obtained. Timeout was completed.  The knee was prepped with alcohol and ethyl chloride. A 20-gauge needle was used to inject 1 bile of Orthovisc into the joint via lateral approach  There were no complications the wound was sterilely dressed.

## 2014-05-19 NOTE — Patient Instructions (Signed)
osteobiflex cartilage supplements

## 2014-06-23 ENCOUNTER — Other Ambulatory Visit: Payer: Self-pay | Admitting: Nurse Practitioner

## 2014-06-23 ENCOUNTER — Telehealth: Payer: Self-pay | Admitting: Family Medicine

## 2014-06-23 MED ORDER — OMEPRAZOLE 40 MG PO CPDR: 40.0000 mg | DELAYED_RELEASE_CAPSULE | Freq: Every day | ORAL | Status: DC

## 2014-06-23 NOTE — Telephone Encounter (Signed)
Hold on Zantac. Switch to Omeprazole. Stop med and call if any problems.

## 2014-06-23 NOTE — Telephone Encounter (Signed)
Left message on voicemail to return call.

## 2014-06-23 NOTE — Telephone Encounter (Signed)
Stop generic Zantac. Switch to Omeprazole 20 mg qd. General instructions: decrease caffeine; avoid smoking and alcohol; limit citrus foods and chocolate. Limit NSAID use. Call back in 7-10 days if no improvement.

## 2014-06-23 NOTE — Telephone Encounter (Signed)
patient said she was told to stop protonix because it was causing a lot of swelling in her ankles and was told just to take zantac

## 2014-06-23 NOTE — Telephone Encounter (Signed)
Patient says that the ranitidine (ZANTAC) 300 MG tablet that she is currently on is not working at all. She still has acid reflux during the day. Wants to know if she can try something else.    [facility]

## 2014-06-24 NOTE — Telephone Encounter (Signed)
Discussed with patient. Patient verbalized understanding. 

## 2014-06-27 ENCOUNTER — Ambulatory Visit (INDEPENDENT_AMBULATORY_CARE_PROVIDER_SITE_OTHER): Payer: BC Managed Care – PPO | Admitting: *Deleted

## 2014-06-27 DIAGNOSIS — Z308 Encounter for other contraceptive management: Secondary | ICD-10-CM

## 2014-06-27 MED ORDER — MEDROXYPROGESTERONE ACETATE 150 MG/ML IM SUSP
150.0000 mg | Freq: Once | INTRAMUSCULAR | Status: AC
  Administered 2014-06-27: 150 mg via INTRAMUSCULAR

## 2014-06-27 NOTE — Patient Instructions (Signed)
Next depo provera due sept 25th - oct 9th

## 2014-07-07 ENCOUNTER — Ambulatory Visit (INDEPENDENT_AMBULATORY_CARE_PROVIDER_SITE_OTHER): Payer: BC Managed Care – PPO | Admitting: Orthopedic Surgery

## 2014-07-07 ENCOUNTER — Encounter: Payer: Self-pay | Admitting: Orthopedic Surgery

## 2014-07-07 VITALS — BP 115/76 | Ht 68.5 in | Wt 256.0 lb

## 2014-07-07 DIAGNOSIS — M2242 Chondromalacia patellae, left knee: Principal | ICD-10-CM

## 2014-07-07 DIAGNOSIS — M2240 Chondromalacia patellae, unspecified knee: Secondary | ICD-10-CM

## 2014-07-07 DIAGNOSIS — M2241 Chondromalacia patellae, right knee: Secondary | ICD-10-CM

## 2014-07-07 NOTE — Progress Notes (Signed)
Patient ID: Stacie GripKristie D Suddeth, female   DOB: 02-19-1976, 38 y.o.   MRN: [id number]  Chief Complaint  Patient presents with  . Follow-up    recheck left knee s/p orthovisc    38 year old female with chondromalacia left knee status post Orthovisc x3. She reports major improvement in her left knee. She has some discomfort in the right knee is interested in Orthovisc for the right knee.  Secondary complaint of pain on the medial side of the right leg radiating from the knee down into the foot with occasional back pain after walking. Spends most of her day sitting at work and pain is exacerbated while sitting.  BP 115/76  Ht 5' 8.5" (1.74 m)  Wt 256 lb (116.121 kg)  BMI 38.35 kg/m2 General appearance is normal, the patient is alert and oriented x3 with normal mood and affect.  Reflexes were equal at 0 both knees and both ankles. No swelling in either knee. Range of motion normal. No ambulatory disturbances.  Impression chondromalacia right knee with anterior knee pain and pain and pressure when climbing steps  Orthovisc ordered for right knee will start after assurance approval is obtained.

## 2014-07-07 NOTE — Patient Instructions (Signed)
We will order Orthovisc for your right knee Call office to schedule injection when you receive the medication

## 2014-07-29 ENCOUNTER — Encounter: Payer: Self-pay | Admitting: Orthopedic Surgery

## 2014-07-29 ENCOUNTER — Ambulatory Visit (INDEPENDENT_AMBULATORY_CARE_PROVIDER_SITE_OTHER): Payer: BC Managed Care – PPO | Admitting: Orthopedic Surgery

## 2014-07-29 VITALS — BP 120/88 | Ht 68.5 in | Wt 256.0 lb

## 2014-07-29 DIAGNOSIS — M2240 Chondromalacia patellae, unspecified knee: Secondary | ICD-10-CM

## 2014-07-29 DIAGNOSIS — M2241 Chondromalacia patellae, right knee: Secondary | ICD-10-CM

## 2014-07-29 DIAGNOSIS — M2242 Chondromalacia patellae, left knee: Principal | ICD-10-CM

## 2014-07-29 NOTE — Progress Notes (Signed)
Followup visit  Chief Complaint  Patient presents with  . Injections    Orthovisc injection #1 Right knee   BP 120/88  Ht 5' 8.5" (1.74 m)  Wt 256 lb (116.121 kg)  BMI 38.35 kg/m2  Right knee pain  Orthovisc injection #1 right knee  Orthovisc injection  Preop diagnosis osteoarthritis left knee  Postoperative diagnosis same  Procedure Orthovisc injection  Verbal consent was obtained. Timeout was completed.  The knee was prepped with alcohol and ethyl chloride. A 20-gauge needle was used to inject 1 bile of Orthovisc into the joint via lateral approach  There were no complications the wound was sterilely dressed.

## 2014-08-05 ENCOUNTER — Encounter: Payer: Self-pay | Admitting: Orthopedic Surgery

## 2014-08-05 ENCOUNTER — Ambulatory Visit (INDEPENDENT_AMBULATORY_CARE_PROVIDER_SITE_OTHER): Payer: BC Managed Care – PPO | Admitting: Orthopedic Surgery

## 2014-08-05 VITALS — BP 120/77 | Ht 68.5 in | Wt 256.0 lb

## 2014-08-05 DIAGNOSIS — M2240 Chondromalacia patellae, unspecified knee: Secondary | ICD-10-CM

## 2014-08-05 DIAGNOSIS — M2242 Chondromalacia patellae, left knee: Principal | ICD-10-CM

## 2014-08-05 DIAGNOSIS — M2241 Chondromalacia patellae, right knee: Secondary | ICD-10-CM

## 2014-08-05 MED ORDER — DICLOFENAC POTASSIUM 50 MG PO TABS: 50.0000 mg | ORAL_TABLET | Freq: Two times a day (BID) | ORAL | Status: DC

## 2014-08-05 NOTE — Progress Notes (Signed)
Orthovisc injection  Preop diagnosis osteoarthritis right knee  Postoperative diagnosis same  Procedure Orthovisc injection  Verbal consent was obtained. Timeout was completed.  The knee was prepped with alcohol and ethyl chloride. A 20-gauge needle was used to inject 1vile of Orthovisc into the joint via lateral approach  There were no complications the wound was sterilely dressed.

## 2014-08-11 ENCOUNTER — Telehealth: Payer: Self-pay | Admitting: Nurse Practitioner

## 2014-08-11 DIAGNOSIS — Z309 Encounter for contraceptive management, unspecified: Secondary | ICD-10-CM

## 2014-08-11 NOTE — Telephone Encounter (Signed)
Patient said that when she was here in April, we were supposed to be referring her to OBGYN that does E-sure procedure. Please advise.

## 2014-08-12 ENCOUNTER — Ambulatory Visit (INDEPENDENT_AMBULATORY_CARE_PROVIDER_SITE_OTHER): Payer: BC Managed Care – PPO | Admitting: Orthopedic Surgery

## 2014-08-12 ENCOUNTER — Encounter: Payer: Self-pay | Admitting: Orthopedic Surgery

## 2014-08-12 DIAGNOSIS — M2242 Chondromalacia patellae, left knee: Principal | ICD-10-CM

## 2014-08-12 DIAGNOSIS — M2240 Chondromalacia patellae, unspecified knee: Secondary | ICD-10-CM

## 2014-08-12 DIAGNOSIS — M2241 Chondromalacia patellae, right knee: Secondary | ICD-10-CM

## 2014-08-12 NOTE — Progress Notes (Signed)
Third Synvisc injection right knee patient getting some synovitis from the Synvisc injections I decided to inject from the medial side  Third injection performed  Orthovisc injection  Preop diagnosis osteoarthritis right knee Postoperative diagnosis same  Procedure Orthovisc injection  Verbal consent was obtained. Timeout was completed.  The knee was prepped with alcohol and ethyl chloride. A 20-gauge needle was used to inject 1 vile of Orthovisc into the joint via lateral approach  There were no complications the wound was sterilely dressed.

## 2014-08-13 NOTE — Telephone Encounter (Signed)
Please refer to GYN but clarify; note says it is for Nexplanon thanks.

## 2014-08-13 NOTE — Telephone Encounter (Signed)
Patient states she wants E-sure procedure done, not Nexplanon. Referral initiated in system.

## 2014-08-26 ENCOUNTER — Encounter: Payer: Self-pay | Admitting: Family Medicine

## 2014-09-11 ENCOUNTER — Ambulatory Visit: Payer: BC Managed Care – PPO | Admitting: Orthopedic Surgery

## 2014-09-12 ENCOUNTER — Ambulatory Visit: Payer: BC Managed Care – PPO

## 2014-09-16 ENCOUNTER — Ambulatory Visit (INDEPENDENT_AMBULATORY_CARE_PROVIDER_SITE_OTHER): Payer: BC Managed Care – PPO | Admitting: *Deleted

## 2014-09-16 ENCOUNTER — Encounter: Payer: Self-pay | Admitting: Family Medicine

## 2014-09-16 DIAGNOSIS — Z309 Encounter for contraceptive management, unspecified: Secondary | ICD-10-CM

## 2014-09-16 DIAGNOSIS — Z23 Encounter for immunization: Secondary | ICD-10-CM

## 2014-09-16 MED ORDER — MEDROXYPROGESTERONE ACETATE 150 MG/ML IM SUSP
150.0000 mg | Freq: Once | INTRAMUSCULAR | Status: AC
  Administered 2014-09-16: 150 mg via INTRAMUSCULAR

## 2014-09-25 ENCOUNTER — Ambulatory Visit (INDEPENDENT_AMBULATORY_CARE_PROVIDER_SITE_OTHER): Payer: BC Managed Care – PPO | Admitting: Orthopedic Surgery

## 2014-09-25 ENCOUNTER — Encounter: Payer: Self-pay | Admitting: Orthopedic Surgery

## 2014-09-25 VITALS — BP 124/76 | Ht 68.5 in | Wt 256.0 lb

## 2014-09-25 DIAGNOSIS — M2241 Chondromalacia patellae, right knee: Secondary | ICD-10-CM

## 2014-09-25 DIAGNOSIS — M2242 Chondromalacia patellae, left knee: Secondary | ICD-10-CM

## 2014-09-25 NOTE — Progress Notes (Signed)
Chief Complaint  Patient presents with  . Follow-up    recheck Rt knee following orthovisc    The patient has received all of her Synvisc injections and did not get as good of a response in the right knee and she didn't left but overall improved.  Review of systems 1-2 times a week she'll get a sharp pain in the knee requiring a brace  She still in her anti-inflammatory medications and takes Norco only on an occasional basis  At this point we will continue with bracing and medication if her symptoms become constant or worse and wouldn't recommend arthroscopic surgery.

## 2014-10-23 ENCOUNTER — Other Ambulatory Visit: Payer: Self-pay | Admitting: *Deleted

## 2014-10-23 MED ORDER — OMEPRAZOLE 40 MG PO CPDR: 40.0000 mg | DELAYED_RELEASE_CAPSULE | Freq: Every day | ORAL | Status: DC

## 2014-12-31 ENCOUNTER — Encounter: Payer: Self-pay | Admitting: Nurse Practitioner

## 2014-12-31 ENCOUNTER — Encounter: Payer: Self-pay | Admitting: Family Medicine

## 2014-12-31 ENCOUNTER — Ambulatory Visit (INDEPENDENT_AMBULATORY_CARE_PROVIDER_SITE_OTHER): Payer: Managed Care, Other (non HMO) | Admitting: Nurse Practitioner

## 2014-12-31 VITALS — BP 116/80 | Ht 68.5 in | Wt 266.0 lb

## 2014-12-31 DIAGNOSIS — M722 Plantar fascial fibromatosis: Secondary | ICD-10-CM

## 2014-12-31 DIAGNOSIS — Z308 Encounter for other contraceptive management: Secondary | ICD-10-CM

## 2014-12-31 LAB — POCT URINE PREGNANCY: Preg Test, Ur: NEGATIVE

## 2014-12-31 MED ORDER — PHENTERMINE HCL 37.5 MG PO TABS: 37.5000 mg | ORAL_TABLET | Freq: Every day | ORAL | Status: DC

## 2014-12-31 MED ORDER — DICLOFENAC POTASSIUM 50 MG PO TABS: 50.0000 mg | ORAL_TABLET | Freq: Two times a day (BID) | ORAL | Status: DC

## 2014-12-31 MED ORDER — MEDROXYPROGESTERONE ACETATE 150 MG/ML IM SUSP
150.0000 mg | Freq: Once | INTRAMUSCULAR | Status: AC
  Administered 2014-12-31: 150 mg via INTRAMUSCULAR

## 2014-12-31 NOTE — Patient Instructions (Signed)
Plantar Fasciitis  Plantar fasciitis is a common condition that causes foot pain. It is soreness (inflammation) of the band of tough fibrous tissue on the bottom of the foot that runs from the heel bone (calcaneus) to the ball of the foot. The cause of this soreness may be from excessive standing, poor fitting shoes, running on hard surfaces, being overweight, having an abnormal walk, or overuse (this is common in runners) of the painful foot or feet. It is also common in aerobic exercise dancers and ballet dancers.  SYMPTOMS   Most people with plantar fasciitis complain of:   Severe pain in the morning on the bottom of their foot especially when taking the first steps out of bed. This pain recedes after a few minutes of walking.   Severe pain is experienced also during walking following a long period of inactivity.   Pain is worse when walking barefoot or up stairs  DIAGNOSIS    Your caregiver will diagnose this condition by examining and feeling your foot.   Special tests such as X-rays of your foot, are usually not needed.  PREVENTION    Consult a sports medicine professional before beginning a new exercise program.   Walking programs offer a good workout. With walking there is a lower chance of overuse injuries common to runners. There is less impact and less jarring of the joints.   Begin all new exercise programs slowly. If problems or pain develop, decrease the amount of time or distance until you are at a comfortable level.   Wear good shoes and replace them regularly.   Stretch your foot and the heel cords at the back of the ankle (Achilles tendon) both before and after exercise.   Run or exercise on even surfaces that are not hard. For example, asphalt is better than pavement.   Do not run barefoot on hard surfaces.   If using a treadmill, vary the incline.   Do not continue to workout if you have foot or joint problems. Seek professional help if they do not improve.  HOME CARE INSTRUCTIONS     Avoid activities that cause you pain until you recover.   Use ice or cold packs on the problem or painful areas after working out.   Only take over-the-counter or prescription medicines for pain, discomfort, or fever as directed by your caregiver.   Soft shoe inserts or athletic shoes with air or gel sole cushions may be helpful.   If problems continue or become more severe, consult a sports medicine caregiver or your own health care provider. Cortisone is a potent anti-inflammatory medication that may be injected into the painful area. You can discuss this treatment with your caregiver.  MAKE SURE YOU:    Understand these instructions.   Will watch your condition.   Will get help right away if you are not doing well or get worse.  Document Released: 08/30/2001 Document Revised: 02/27/2012 Document Reviewed: 10/29/2008  [employer] Patient Information 2015 [employer]. This information is not intended to replace advice given to you by your health care provider. Make sure you discuss any questions you have with your health care provider.

## 2015-01-04 ENCOUNTER — Encounter: Payer: Self-pay | Admitting: Nurse Practitioner

## 2015-01-04 NOTE — Progress Notes (Signed)
Subjective:  Presents to discuss restarting Depo Provera. Specialist was unable to do endometrial ablation. Caused severe cramping which has resolved. Due for Depo 12/29. No bleeding. No intercourse. Also would like to restart Phentermine. Has taken in the past without difficulty. Very stressed lately.has not done well with her diet. Also c/o pain in the soles of both feet near the heel especially when she first gets up in the morning and when she first stands up after sitting for awhile.  Objective:   BP 116/80 mmHg  Ht 5' 8.5" (1.74 m)  Wt 266 lb (120.657 kg)  BMI 39.85 kg/m2 NAD. Alert, oriented. Lungs clear. Heart RRR. Tenderness with palpation in the arch of both feet near the heels.  Results for orders placed or performed in visit on 12/31/14  POCT urine pregnancy  Result Value Ref Range   Preg Test, Ur Negative      Assessment: Encounter for other contraceptive management - Plan: POCT urine pregnancy, medroxyPROGESTERone (DEPO-PROVERA) injection 150 mg  Plantar fasciitis, bilateral - Plan: diclofenac (CATAFLAM) 50 MG tablet  Morbid obesity  Plan:  Meds ordered this encounter  Medications  . medroxyPROGESTERone (DEPO-PROVERA) injection 150 mg    Sig:   . diclofenac (CATAFLAM) 50 MG tablet    Sig: Take 1 tablet (50 mg total) by mouth 2 (two) times daily.    Dispense:  60 tablet    Refill:  2    Order Specific Question:  Supervising Provider    Answer:  Merlyn AlbertLUKING, WILLIAM S [[id number]]  . phentermine (ADIPEX-P) 37.5 MG tablet    Sig: Take 1 tablet (37.5 mg total) by mouth daily before breakfast.    Dispense:  30 tablet    Refill:  2    Order Specific Question:  Supervising Provider    Answer:  Merlyn AlbertLUKING, WILLIAM S [[id number]]   Given information on plantar fasciitis. Wear supportive shoes with arch supports. Recommend patient schedule appt with podiatry if continues. Discussed healthy diet and regular exercise.  Recheck here as needed.

## 2015-03-10 ENCOUNTER — Encounter: Payer: Self-pay | Admitting: Family Medicine

## 2015-03-10 ENCOUNTER — Ambulatory Visit (INDEPENDENT_AMBULATORY_CARE_PROVIDER_SITE_OTHER): Payer: Managed Care, Other (non HMO) | Admitting: Family Medicine

## 2015-03-10 VITALS — BP 132/84 | Temp 98.8°F | Ht 69.0 in | Wt 255.0 lb

## 2015-03-10 DIAGNOSIS — J329 Chronic sinusitis, unspecified: Secondary | ICD-10-CM | POA: Diagnosis not present

## 2015-03-10 MED ORDER — OMEPRAZOLE 40 MG PO CPDR: 40.0000 mg | DELAYED_RELEASE_CAPSULE | Freq: Every day | ORAL | Status: DC

## 2015-03-10 MED ORDER — CEFDINIR 300 MG PO CAPS: 300.0000 mg | ORAL_CAPSULE | Freq: Two times a day (BID) | ORAL | Status: DC

## 2015-03-10 NOTE — Progress Notes (Signed)
   Subjective:    Patient ID: Stacie GripKristie D Roberts, female    DOB: [birth year], 39 y.o.   MRN: [id number]  Sinusitis This is a new problem. Episode onset: Friday. The problem has been gradually worsening since onset. There has been no fever. Associated symptoms include congestion, coughing, headaches, sinus pressure, sneezing and a sore throat. Past treatments include oral decongestants. The treatment provided mild relief.   No sig allergies  No close fam mem with alergies or sickness  Pot exposure  Last wk drainage and sneezing and sinus pressure   frontal  Once per yr ish   otc dayquil etc. nyquil prn  A lot of fdrainage   Gunky  nfrontal and maxillary      Review of Systems  HENT: Positive for congestion, sinus pressure, sneezing and sore throat.   Respiratory: Positive for cough.   Neurological: Positive for headaches.       Objective:   Physical Exam Alert moderate malaise. Vital stable frontal max or tenderness evident pharynx normal neck supple. Lungs clear. Heart regular in rhythm.       Assessment & Plan:  Impression acute rhinosinusitis likely post viral discussed plan antibiotics prescribed. Symptom care discussed. Warning signs discussed WSL

## 2015-03-19 ENCOUNTER — Ambulatory Visit: Payer: Managed Care, Other (non HMO)

## 2015-03-31 ENCOUNTER — Ambulatory Visit: Payer: Managed Care, Other (non HMO)

## 2015-03-31 ENCOUNTER — Ambulatory Visit (INDEPENDENT_AMBULATORY_CARE_PROVIDER_SITE_OTHER): Payer: Managed Care, Other (non HMO) | Admitting: *Deleted

## 2015-03-31 DIAGNOSIS — Z3042 Encounter for surveillance of injectable contraceptive: Secondary | ICD-10-CM | POA: Diagnosis not present

## 2015-03-31 MED ORDER — MEDROXYPROGESTERONE ACETATE 150 MG/ML IM SUSP
150.0000 mg | Freq: Once | INTRAMUSCULAR | Status: AC
  Administered 2015-03-31: 150 mg via INTRAMUSCULAR

## 2015-05-12 ENCOUNTER — Telehealth: Payer: Self-pay | Admitting: Nurse Practitioner

## 2015-05-12 NOTE — Telephone Encounter (Signed)
diclofenac (CATAFLAM) 50 MG tablet   Pt would [facility] to [facility] [address] [place] [phone] fax

## 2015-05-13 ENCOUNTER — Other Ambulatory Visit: Payer: Self-pay | Admitting: *Deleted

## 2015-05-13 ENCOUNTER — Other Ambulatory Visit: Payer: Self-pay | Admitting: Nurse Practitioner

## 2015-05-13 DIAGNOSIS — M722 Plantar fascial fibromatosis: Secondary | ICD-10-CM

## 2015-05-13 MED ORDER — DICLOFENAC POTASSIUM 50 MG PO TABS: 50.0000 mg | ORAL_TABLET | Freq: Two times a day (BID) | ORAL | Status: DC

## 2015-05-13 NOTE — Telephone Encounter (Signed)
Sent Rx in electronically 

## 2015-05-13 NOTE — Telephone Encounter (Signed)
Patient notified

## 2015-05-27 ENCOUNTER — Encounter: Payer: Self-pay | Admitting: Nurse Practitioner

## 2015-05-27 ENCOUNTER — Ambulatory Visit (INDEPENDENT_AMBULATORY_CARE_PROVIDER_SITE_OTHER): Payer: Managed Care, Other (non HMO) | Admitting: Nurse Practitioner

## 2015-05-27 VITALS — BP 118/76 | Ht 69.0 in | Wt 259.0 lb

## 2015-05-27 DIAGNOSIS — A499 Bacterial infection, unspecified: Secondary | ICD-10-CM | POA: Diagnosis not present

## 2015-05-27 DIAGNOSIS — Z1231 Encounter for screening mammogram for malignant neoplasm of breast: Secondary | ICD-10-CM | POA: Diagnosis not present

## 2015-05-27 DIAGNOSIS — Z1151 Encounter for screening for human papillomavirus (HPV): Secondary | ICD-10-CM

## 2015-05-27 DIAGNOSIS — Z0000 Encounter for general adult medical examination without abnormal findings: Secondary | ICD-10-CM

## 2015-05-27 DIAGNOSIS — Z124 Encounter for screening for malignant neoplasm of cervix: Secondary | ICD-10-CM | POA: Diagnosis not present

## 2015-05-27 DIAGNOSIS — N760 Acute vaginitis: Secondary | ICD-10-CM

## 2015-05-27 DIAGNOSIS — Z13220 Encounter for screening for lipoid disorders: Secondary | ICD-10-CM | POA: Diagnosis not present

## 2015-05-27 DIAGNOSIS — Z79899 Other long term (current) drug therapy: Secondary | ICD-10-CM

## 2015-05-27 DIAGNOSIS — Z113 Encounter for screening for infections with a predominantly sexual mode of transmission: Secondary | ICD-10-CM | POA: Diagnosis not present

## 2015-05-27 DIAGNOSIS — B9689 Other specified bacterial agents as the cause of diseases classified elsewhere: Secondary | ICD-10-CM

## 2015-05-27 MED ORDER — METRONIDAZOLE 500 MG PO TABS: 500.0000 mg | ORAL_TABLET | Freq: Two times a day (BID) | ORAL | Status: DC

## 2015-05-28 ENCOUNTER — Encounter: Payer: Self-pay | Admitting: Nurse Practitioner

## 2015-05-28 LAB — POCT WET PREP WITH KOH
BACTERIA WET PREP HPF POC: POSITIVE
Clue Cells Wet Prep HPF POC: POSITIVE
EPITHELIAL WET PREP PER HPF POC: POSITIVE
KOH PREP POC: POSITIVE
Trichomonas, UA: NEGATIVE
Yeast Wet Prep HPF POC: NEGATIVE
pH, Wet Prep: 6

## 2015-05-28 NOTE — Progress Notes (Signed)
Subjective:    Patient ID: Stacie Roberts, female    DOB: March 02, 1976, 39 y.o.   MRN: [id number]  HPI presents for her wellness exam. No cycle on Depo Provera. Last intercourse was about 6 months ago with a regular partner. Regular vision exams. Needs dental exam. Regular exercise; walks about 2 miles per day. Is due for her next colonoscopy April 2017. Will call so when she is ready for referral.     Review of Systems  Constitutional: Negative for fever, activity change, appetite change and fatigue.  HENT: Negative for dental problem, ear pain, sinus pressure and sore throat.   Respiratory: Negative for cough, chest tightness, shortness of breath and wheezing.   Cardiovascular: Negative for chest pain.  Gastrointestinal: Negative for nausea, vomiting, abdominal pain, diarrhea, constipation and abdominal distention.  Genitourinary: Positive for vaginal discharge. Negative for dysuria, urgency, frequency, vaginal bleeding, enuresis, difficulty urinating, genital sores, menstrual problem and pelvic pain.       Objective:   Physical Exam  Constitutional: She is oriented to person, place, and time. She appears well-developed. No distress.  HENT:  Right Ear: External ear normal.  Left Ear: External ear normal.  Mouth/Throat: Oropharynx is clear and moist.  Neck: Normal range of motion. Neck supple. No tracheal deviation present. No thyromegaly present.  Cardiovascular: Normal rate, regular rhythm and normal heart sounds.  Exam reveals no gallop.   No murmur heard. Pulmonary/Chest: Effort normal and breath sounds normal.  Abdominal: Soft. She exhibits no distension. There is no tenderness.  Genitourinary: Vagina normal and uterus normal. No vaginal discharge found.  External GU: No rashes or lesions. Vagina a large amount of thin slightly yellowish discharge noted. Cervix normal limit in appearance. No CMT. Bimanual exam no tenderness or obvious masses.  Musculoskeletal: She exhibits no  edema.  Lymphadenopathy:    She has no cervical adenopathy.  Neurological: She is alert and oriented to person, place, and time.  Skin: Skin is warm and dry. No rash noted.  Psychiatric: She has a normal mood and affect. Her behavior is normal.  Vitals reviewed.  Breast exam: No masses noted; axilla no adenopathy. Results for orders placed or performed in visit on 05/27/15  POCT Wet Prep with KOH  Result Value Ref Range   Trichomonas, UA Negative    Clue Cells Wet Prep HPF POC pos    Epithelial Wet Prep HPF POC pos    Yeast Wet Prep HPF POC neg    Bacteria Wet Prep HPF POC pos    RBC Wet Prep HPF POC rare    WBC Wet Prep HPF POC multiple    KOH Prep POC Positive    pH, Wet Prep 6.0           Assessment & Plan:   Problem List Items Addressed This Visit      Other   Morbid obesity (Chronic)   Relevant Orders   Basic metabolic panel    Other Visit Diagnoses    Routine general medical examination at a health care facility    -  Primary    Relevant Orders    Pap IG, CT/NG NAA, and HPV (high risk)    Lipid panel    Hepatic function panel    Basic metabolic panel    MM DIGITAL SCREENING BILATERAL    Screening for cervical cancer        Relevant Orders    Pap IG, CT/NG NAA, and HPV (high risk)    Bacterial  vaginosis        Relevant Medications    metroNIDAZOLE (FLAGYL) 500 MG tablet    Other Relevant Orders    POCT Wet Prep with KOH (Completed)    Screening for HPV (human papillomavirus)        Relevant Orders    Pap IG, CT/NG NAA, and HPV (high risk)    Screen for STD (sexually transmitted disease)        Relevant Orders    Pap IG, CT/NG NAA, and HPV (high risk)    Screening for cholesterol level        Relevant Orders    Lipid panel    High risk medication use        Relevant Orders    Hepatic function panel    Visit for screening mammogram        Relevant Orders    MM DIGITAL SCREENING BILATERAL      Meds ordered this encounter  Medications  .  metroNIDAZOLE (FLAGYL) 500 MG tablet    Sig: Take 1 tablet (500 mg total) by mouth 2 (two) times daily with a meal.    Dispense:  14 tablet    Refill:  0    Order Specific Question:  Supervising Provider    Answer:  Merlyn Albert [[id number]]   Encouraged healthy diet regular activity and weight loss. Daily vitamin D and calcium supplement. Discussed safe sex issues. Given information on weight loss medications. Return in about 1 year (around 05/26/2016) for physical.

## 2015-06-01 ENCOUNTER — Telehealth: Payer: Self-pay

## 2015-06-01 LAB — PAP IG, CT-NG NAA, HPV HIGH-RISK
Chlamydia, Nuc. Acid Amp: NEGATIVE
Gonococcus by Nucleic Acid Amp: NEGATIVE
HPV, high-risk: NEGATIVE
PAP Smear Comment: 0

## 2015-06-01 NOTE — Telephone Encounter (Signed)
Notified patient it may help since Mirena is a different type of progesterone. Also, it should stop your cycles as well. She needs to check with insurance to see if they will pay for this and how much is her co pay. Patient verbalized understanding.

## 2015-06-01 NOTE — Telephone Encounter (Signed)
Patient states that she has been taking the Depo shots for a long time now. She is trying to lose weight and the Depo causes weight gain. She is considering getting the Mirena and wants to know do you think she will benefit by getting this in order to lose weight?

## 2015-06-01 NOTE — Telephone Encounter (Signed)
It may help since Mirena is a different type of progesterone. Also, it should stop your cycles as well. She needs to check with insurance to see if they will pay for this and how much is her co pay.

## 2015-06-08 ENCOUNTER — Ambulatory Visit (HOSPITAL_COMMUNITY): Admission: RE | Admit: 2015-06-08 | Payer: Managed Care, Other (non HMO) | Source: Ambulatory Visit

## 2015-06-08 ENCOUNTER — Other Ambulatory Visit: Payer: Self-pay | Admitting: Nurse Practitioner

## 2015-06-08 ENCOUNTER — Ambulatory Visit (HOSPITAL_COMMUNITY)
Admission: RE | Admit: 2015-06-08 | Discharge: 2015-06-08 | Disposition: A | Discharge status: Home / Self Care | Payer: Managed Care, Other (non HMO) | Source: Ambulatory Visit | Attending: Nurse Practitioner | Admitting: Nurse Practitioner

## 2015-06-08 DIAGNOSIS — Z1231 Encounter for screening mammogram for malignant neoplasm of breast: Secondary | ICD-10-CM | POA: Insufficient documentation

## 2015-06-09 LAB — BASIC METABOLIC PANEL
BUN/Creatinine Ratio: 11 (ref 9–23)
BUN: 9 mg/dL (ref 6–24)
CO2: 24 mmol/L (ref 18–29)
Calcium: 9.1 mg/dL (ref 8.7–10.2)
Chloride: 104 mmol/L (ref 97–108)
Creatinine, Ser: 0.81 mg/dL (ref 0.57–1.00)
GFR calc Af Amer: 105 mL/min/{1.73_m2} (ref >59)
GFR, EST NON AFRICAN AMERICAN: 91 mL/min/{1.73_m2} (ref >59)
Glucose: 107 mg/dL — ABNORMAL HIGH (ref 65–99)
Potassium: 4.5 mmol/L (ref 3.5–5.2)
Sodium: 141 mmol/L (ref 134–144)

## 2015-06-09 LAB — HEPATIC FUNCTION PANEL
ALBUMIN: 3.9 g/dL (ref 3.5–5.5)
ALK PHOS: 96 IU/L (ref 39–117)
ALT: 24 IU/L (ref 0–32)
AST: 20 IU/L (ref 0–40)
Bilirubin Total: 1.1 mg/dL (ref 0.0–1.2)
Bilirubin, Direct: 0.24 mg/dL (ref 0.00–0.40)
TOTAL PROTEIN: 6.5 g/dL (ref 6.0–8.5)

## 2015-06-09 LAB — LIPID PANEL
CHOL/HDL RATIO: 4.7 ratio — AB (ref 0.0–4.4)
Cholesterol, Total: 155 mg/dL (ref 100–199)
HDL: 33 mg/dL — AB (ref >39)
LDL Calculated: 97 mg/dL (ref 0–99)
TRIGLYCERIDES: 123 mg/dL (ref 0–149)
VLDL Cholesterol Cal: 25 mg/dL (ref 5–40)

## 2015-06-16 ENCOUNTER — Ambulatory Visit: Payer: Managed Care, Other (non HMO)

## 2015-07-20 ENCOUNTER — Other Ambulatory Visit: Payer: Self-pay | Admitting: Family Medicine

## 2015-09-02 ENCOUNTER — Telehealth: Payer: Self-pay | Admitting: Nurse Practitioner

## 2015-09-02 MED ORDER — HYDROCHLOROTHIAZIDE 25 MG PO TABS: ORAL_TABLET | ORAL | Status: DC

## 2015-09-02 MED ORDER — POTASSIUM CHLORIDE ER 10 MEQ PO TBCR: EXTENDED_RELEASE_TABLET | ORAL | Status: DC

## 2015-09-02 NOTE — Telephone Encounter (Signed)
The patient may have hydrochlorothiazide refill. She also needs potassium 10 meq 1 each morning with hydrochlorothiazide when she takes it. May have 30 of each with 2 refills regular follow-ups ear every 6 months recommended

## 2015-09-02 NOTE — Telephone Encounter (Signed)
Pt would like some more  hydrochlorothiazide (HYDRODIURIL) 25 MG tablet  Feet/ankles a re swelling each morning again    [facility]

## 2015-09-02 NOTE — Telephone Encounter (Signed)
Rx sent electronically to pharmacy. Patient notified. 

## 2015-09-24 ENCOUNTER — Other Ambulatory Visit: Payer: Self-pay | Admitting: *Deleted

## 2015-09-24 DIAGNOSIS — M722 Plantar fascial fibromatosis: Secondary | ICD-10-CM

## 2015-09-28 MED ORDER — DICLOFENAC POTASSIUM 50 MG PO TABS: 50.0000 mg | ORAL_TABLET | Freq: Two times a day (BID) | ORAL | Status: DC

## 2015-10-21 ENCOUNTER — Telehealth: Payer: Self-pay | Admitting: Nurse Practitioner

## 2015-10-21 NOTE — Telephone Encounter (Signed)
Pt had recent PE, wants to know if she can get a lab that will  Test her for HIV, states she noticed it is not on her labs from  That visit and would like for it to be ran .    Call when ready

## 2015-10-22 ENCOUNTER — Other Ambulatory Visit: Payer: Self-pay | Admitting: *Deleted

## 2015-10-22 DIAGNOSIS — Z113 Encounter for screening for infections with a predominantly sexual mode of transmission: Secondary | ICD-10-CM

## 2015-10-22 NOTE — Telephone Encounter (Signed)
Orders in  Pt notified

## 2015-10-22 NOTE — Telephone Encounter (Signed)
Please order HIV per her request. She has already been tested for Hepatitis C which was negative.

## 2015-10-25 LAB — HIV ANTIBODY (ROUTINE TESTING W REFLEX): HIV Screen 4th Generation wRfx: NONREACTIVE

## 2015-11-04 ENCOUNTER — Other Ambulatory Visit: Payer: Self-pay | Admitting: Family Medicine

## 2015-11-04 ENCOUNTER — Other Ambulatory Visit: Payer: Self-pay | Admitting: Nurse Practitioner

## 2015-11-16 ENCOUNTER — Other Ambulatory Visit: Payer: Self-pay | Admitting: Nurse Practitioner

## 2016-05-02 ENCOUNTER — Other Ambulatory Visit: Payer: Self-pay | Admitting: Family Medicine

## 2016-05-26 ENCOUNTER — Ambulatory Visit (INDEPENDENT_AMBULATORY_CARE_PROVIDER_SITE_OTHER): Payer: Managed Care, Other (non HMO) | Admitting: Nurse Practitioner

## 2016-05-26 ENCOUNTER — Encounter: Payer: Self-pay | Admitting: Nurse Practitioner

## 2016-05-26 VITALS — BP 112/78 | Ht 68.5 in | Wt 253.0 lb

## 2016-05-26 DIAGNOSIS — Z0000 Encounter for general adult medical examination without abnormal findings: Secondary | ICD-10-CM

## 2016-05-26 MED ORDER — PHENTERMINE HCL 37.5 MG PO TABS: 37.5000 mg | ORAL_TABLET | Freq: Every day | ORAL | Status: DC

## 2016-05-27 ENCOUNTER — Encounter: Payer: Self-pay | Admitting: Nurse Practitioner

## 2016-05-27 LAB — CBC WITH DIFFERENTIAL/PLATELET
Basophils Absolute: 0 10*3/uL (ref 0.0–0.2)
Basos: 0 %
EOS (ABSOLUTE): 0 10*3/uL (ref 0.0–0.4)
EOS: 0 %
HEMATOCRIT: 41.3 % (ref 34.0–46.6)
HEMOGLOBIN: 13.8 g/dL (ref 11.1–15.9)
IMMATURE GRANULOCYTES: 0 %
Immature Grans (Abs): 0 10*3/uL (ref 0.0–0.1)
LYMPHS: 28 %
Lymphocytes Absolute: 2.1 10*3/uL (ref 0.7–3.1)
MCH: 30.9 pg (ref 26.6–33.0)
MCHC: 33.4 g/dL (ref 31.5–35.7)
MCV: 92 fL (ref 79–97)
MONOCYTES: 6 %
MONOS ABS: 0.4 10*3/uL (ref 0.1–0.9)
NEUTROS PCT: 66 %
Neutrophils Absolute: 4.7 10*3/uL (ref 1.4–7.0)
Platelets: 290 10*3/uL (ref 150–379)
RBC: 4.47 x10E6/uL (ref 3.77–5.28)
RDW: 12.7 % (ref 12.3–15.4)
WBC: 7.3 10*3/uL (ref 3.4–10.8)

## 2016-05-27 LAB — HEPATIC FUNCTION PANEL
ALT: 27 IU/L (ref 0–32)
AST: 23 IU/L (ref 0–40)
Albumin: 4.1 g/dL (ref 3.5–5.5)
Alkaline Phosphatase: 95 IU/L (ref 39–117)
Bilirubin Total: 1.5 mg/dL — ABNORMAL HIGH (ref 0.0–1.2)
Bilirubin, Direct: 0.34 mg/dL (ref 0.00–0.40)
TOTAL PROTEIN: 7 g/dL (ref 6.0–8.5)

## 2016-05-27 LAB — BASIC METABOLIC PANEL
BUN/Creatinine Ratio: 13 (ref 9–23)
BUN: 10 mg/dL (ref 6–24)
CALCIUM: 9.3 mg/dL (ref 8.7–10.2)
CO2: 24 mmol/L (ref 18–29)
CREATININE: 0.8 mg/dL (ref 0.57–1.00)
Chloride: 102 mmol/L (ref 96–106)
GFR calc Af Amer: 106 mL/min/{1.73_m2} (ref >59)
GFR, EST NON AFRICAN AMERICAN: 92 mL/min/{1.73_m2} (ref >59)
Glucose: 81 mg/dL (ref 65–99)
Potassium: 4.6 mmol/L (ref 3.5–5.2)
Sodium: 141 mmol/L (ref 134–144)

## 2016-05-27 LAB — LIPID PANEL
Chol/HDL Ratio: 4.3 ratio units (ref 0.0–4.4)
Cholesterol, Total: 154 mg/dL (ref 100–199)
HDL: 36 mg/dL — ABNORMAL LOW (ref >39)
LDL CALC: 98 mg/dL (ref 0–99)
Triglycerides: 101 mg/dL (ref 0–149)
VLDL CHOLESTEROL CAL: 20 mg/dL (ref 5–40)

## 2016-05-27 LAB — VITAMIN D 25 HYDROXY (VIT D DEFICIENCY, FRACTURES): VIT D 25 HYDROXY: 15.1 ng/mL — AB (ref 30.0–100.0)

## 2016-05-27 LAB — TSH: TSH: 1.03 u[IU]/mL (ref 0.450–4.500)

## 2016-05-27 NOTE — Progress Notes (Signed)
   Subjective:    Patient ID: Stacie Roberts, female    DOB: [birth year], 40 y.o.   MRN: [id number]  HPI presents for her wellness exam. Has Mirena. No vaginal bleeding. No new sexual partners. Walking about 2 miles per day; goal 4000 steps. Regular vision and dental exams. Would like to restart Phentermine to help with weight loss. Has taken without difficulty in the past.     Review of Systems  Constitutional: Negative for activity change, appetite change and fatigue.  HENT: Negative for dental problem, ear pain, sinus pressure and sore throat.   Respiratory: Negative for cough, chest tightness, shortness of breath and wheezing.   Cardiovascular: Negative for chest pain.  Gastrointestinal: Negative for nausea, vomiting, abdominal pain, diarrhea, constipation, blood in stool and abdominal distention.  Genitourinary: Negative for dysuria, urgency, frequency, vaginal bleeding, vaginal discharge, enuresis, difficulty urinating, genital sores, menstrual problem and pelvic pain.       Objective:   Physical Exam  Constitutional: She is oriented to person, place, and time. She appears well-developed. No distress.  HENT:  Right Ear: External ear normal.  Left Ear: External ear normal.  Mouth/Throat: Oropharynx is clear and moist.  Neck: Normal range of motion. Neck supple. No tracheal deviation present. No thyromegaly present.  Cardiovascular: Normal rate, regular rhythm and normal heart sounds.  Exam reveals no gallop.   No murmur heard. Pulmonary/Chest: Effort normal and breath sounds normal.  Abdominal: Soft. She exhibits no distension. There is no tenderness.  Genitourinary: Vagina normal and uterus normal. No vaginal discharge found.  External GU: no rash or lesions. Vagina: no discharge. Cervix normal in appearance. No CMT. Bimanual exam: no tenderness or obvious masses.   Musculoskeletal: She exhibits no edema.  Lymphadenopathy:    She has no cervical adenopathy.  Neurological: She  is alert and oriented to person, place, and time.  Skin: Skin is warm and dry. No rash noted.  Psychiatric: She has a normal mood and affect. Her behavior is normal.  Vitals reviewed. Breast exam: slightly dense tissue; no masses; axillae no adenopathy.         Assessment & Plan:  Routine general medical examination at a health care facility - Plan: Lipid panel, Hepatic function panel, Basic metabolic panel, TSH, CBC with Differential/Platelet, VITAMIN D 25 Hydroxy (Vit-D Deficiency, Fractures)  Morbid obesity due to excess calories (HCC) - Plan: Lipid panel, Hepatic function panel, Basic metabolic panel, TSH, CBC with Differential/Platelet, VITAMIN D 25 Hydroxy (Vit-D Deficiency, Fractures)  Meds ordered this encounter  Medications  . phentermine (ADIPEX-P) 37.5 MG tablet    Sig: Take 1 tablet (37.5 mg total) by mouth daily before breakfast.    Dispense:  30 tablet    Refill:  2    Order Specific Question:  Supervising Provider    Answer:  Merlyn AlbertLUKING, WILLIAM S [[id number]]   Encouraged continued activity. Recheck in 3 months if she wishes to continue Phentermine. DC med and call if any problems. Return in about 1 year (around 05/26/2017) for physical.

## 2016-05-30 ENCOUNTER — Other Ambulatory Visit: Payer: Self-pay | Admitting: Nurse Practitioner

## 2016-05-30 MED ORDER — VITAMIN D (ERGOCALCIFEROL) 1.25 MG (50000 UNIT) PO CAPS
50000.0000 [IU] | ORAL_CAPSULE | ORAL | Status: DC
Start: 2016-05-30 — End: 2017-05-31

## 2016-05-31 ENCOUNTER — Telehealth: Payer: Self-pay | Admitting: *Deleted

## 2016-05-31 NOTE — Telephone Encounter (Signed)
Wellness form completed, faxed and copy mailed to pt.

## 2016-06-07 ENCOUNTER — Other Ambulatory Visit: Payer: Self-pay | Admitting: Family Medicine

## 2016-06-13 ENCOUNTER — Telehealth: Payer: Self-pay | Admitting: Family Medicine

## 2016-06-13 NOTE — Telephone Encounter (Signed)
Spoke with patient and informed her that we do have a [facility]. Informed her that shot record is ready for pick up. Patient verbalized understanding.

## 2016-06-13 NOTE — Telephone Encounter (Signed)
Patient requesting copy of shot record.  I checked her paper chart and we do not have a shot record on her.  Is there anything in the registry for her?  Please advise.

## 2016-07-12 ENCOUNTER — Other Ambulatory Visit: Payer: Self-pay | Admitting: Nurse Practitioner

## 2016-07-12 NOTE — Telephone Encounter (Signed)
May have refill 3

## 2016-07-15 ENCOUNTER — Other Ambulatory Visit: Payer: Self-pay | Admitting: Nurse Practitioner

## 2016-08-15 ENCOUNTER — Encounter: Payer: Self-pay | Admitting: Nurse Practitioner

## 2016-08-19 ENCOUNTER — Other Ambulatory Visit: Payer: Self-pay | Admitting: Nurse Practitioner

## 2016-08-19 ENCOUNTER — Encounter: Payer: Self-pay | Admitting: Nurse Practitioner

## 2016-08-19 MED ORDER — PHENTERMINE HCL 37.5 MG PO TABS: 37.5000 mg | ORAL_TABLET | Freq: Every day | ORAL | 2 refills | Status: DC

## 2016-08-25 ENCOUNTER — Other Ambulatory Visit: Payer: Self-pay | Admitting: Nurse Practitioner

## 2016-08-25 MED ORDER — PHENTERMINE HCL 37.5 MG PO TABS: 37.5000 mg | ORAL_TABLET | Freq: Every day | ORAL | 2 refills | Status: DC

## 2016-08-31 ENCOUNTER — Ambulatory Visit: Payer: Self-pay | Admitting: Nurse Practitioner

## 2016-09-26 ENCOUNTER — Other Ambulatory Visit: Payer: Self-pay | Admitting: *Deleted

## 2016-09-26 MED FILL — OMEPRAZOLE DR 40 MG CAPSULE: 40 | 30 days supply | Qty: 30 | Fill #0

## 2016-09-26 MED FILL — IBUPROFEN 800 MG TABLET: 800 | 30 days supply | Qty: 90 | Fill #0

## 2016-09-28 MED ORDER — DICLOFENAC POTASSIUM 50 MG PO TABS: ORAL_TABLET | ORAL | 0 refills | Status: DC

## 2016-10-19 MED FILL — DICLOFENAC POT 50 MG TABLET: 50 | 30 days supply | Qty: 60 | Fill #0

## 2016-11-05 DIAGNOSIS — O43893 Other placental disorders, third trimester: Secondary | ICD-10-CM | POA: Diagnosis not present

## 2016-11-05 DIAGNOSIS — Z3A39 39 weeks gestation of pregnancy: Secondary | ICD-10-CM | POA: Diagnosis not present

## 2016-11-09 MED FILL — OMEPRAZOLE DR 40 MG CAPSULE: 40 | 30 days supply | Qty: 30 | Fill #1

## 2016-12-26 ENCOUNTER — Other Ambulatory Visit: Payer: Self-pay | Admitting: Family Medicine

## 2016-12-26 MED FILL — DICLOFENAC POT 50 MG TABLET: 50 | 30 days supply | Qty: 60 | Fill #0

## 2016-12-26 MED FILL — OMEPRAZOLE DR 40 MG CAPSULE: 40 | 30 days supply | Qty: 30 | Fill #2

## 2017-01-17 ENCOUNTER — Other Ambulatory Visit: Payer: Self-pay | Admitting: Nurse Practitioner

## 2017-01-31 ENCOUNTER — Other Ambulatory Visit: Payer: Self-pay | Admitting: Family Medicine

## 2017-01-31 MED FILL — OMEPRAZOLE DR 40 MG CAPSULE: 40 | 30 days supply | Qty: 30 | Fill #3

## 2017-01-31 MED FILL — IBUPROFEN 800 MG TABLET: 800 | 30 days supply | Qty: 90 | Fill #0

## 2017-01-31 NOTE — Telephone Encounter (Signed)
Last seen 05/26/16

## 2017-02-01 MED FILL — DICLOFENAC POT 50 MG TABLET: 50 | 30 days supply | Qty: 60 | Fill #0

## 2017-02-10 DIAGNOSIS — H5213 Myopia, bilateral: Secondary | ICD-10-CM | POA: Diagnosis not present

## 2017-02-13 MED FILL — AMOXICILLIN 500 MG CAPSULE: 500 | 7 days supply | Qty: 21 | Fill #0

## 2017-02-13 MED FILL — HYDROCODON-APAP 5-325: 5-325 | 3 days supply | Qty: 10 | Fill #0

## 2017-03-07 ENCOUNTER — Other Ambulatory Visit: Payer: Self-pay | Admitting: Family Medicine

## 2017-03-07 MED FILL — OMEPRAZOLE DR 40 MG CAPSULE: 40 | 30 days supply | Qty: 30 | Fill #4

## 2017-03-08 MED FILL — DICLOFENAC POT 50 MG TABLET: 50 | 30 days supply | Qty: 60 | Fill #0

## 2017-04-10 ENCOUNTER — Other Ambulatory Visit: Payer: Self-pay | Admitting: Family Medicine

## 2017-04-10 MED FILL — OMEPRAZOLE DR 40 MG CAPSULE: 40 | 30 days supply | Qty: 30 | Fill #0

## 2017-05-12 ENCOUNTER — Other Ambulatory Visit: Payer: Self-pay | Admitting: Family Medicine

## 2017-05-12 MED FILL — OMEPRAZOLE DR 40 MG CAPSULE: 40 | 30 days supply | Qty: 30 | Fill #0

## 2017-05-29 ENCOUNTER — Other Ambulatory Visit: Payer: Self-pay | Admitting: Nurse Practitioner

## 2017-05-29 ENCOUNTER — Encounter: Payer: Self-pay | Admitting: Nurse Practitioner

## 2017-05-29 ENCOUNTER — Ambulatory Visit (INDEPENDENT_AMBULATORY_CARE_PROVIDER_SITE_OTHER): Payer: 59 | Admitting: Nurse Practitioner

## 2017-05-29 VITALS — BP 112/82 | Ht 68.25 in | Wt 245.0 lb

## 2017-05-29 DIAGNOSIS — Z0000 Encounter for general adult medical examination without abnormal findings: Secondary | ICD-10-CM | POA: Diagnosis not present

## 2017-05-29 DIAGNOSIS — Z1231 Encounter for screening mammogram for malignant neoplasm of breast: Secondary | ICD-10-CM

## 2017-05-29 DIAGNOSIS — Z113 Encounter for screening for infections with a predominantly sexual mode of transmission: Secondary | ICD-10-CM

## 2017-05-29 MED ORDER — MELOXICAM 15 MG PO TABS: 15.0000 mg | ORAL_TABLET | Freq: Every day | ORAL | 0 refills | Status: DC

## 2017-05-29 MED ORDER — PHENTERMINE HCL 37.5 MG PO TABS: 37.5000 mg | ORAL_TABLET | Freq: Every day | ORAL | 2 refills | Status: DC

## 2017-05-29 MED ORDER — PANTOPRAZOLE SODIUM 40 MG PO TBEC: 40.0000 mg | DELAYED_RELEASE_TABLET | Freq: Every day | ORAL | 1 refills | Status: DC

## 2017-05-29 MED FILL — PANTOPRAZOLE SOD DR 40 MG T: 40 | 90 days supply | Qty: 90 | Fill #0

## 2017-05-29 MED FILL — MELOXICAM 15 MG TABLET: 15 | 90 days supply | Qty: 90 | Fill #0

## 2017-05-29 MED FILL — PHENTERMINE 37.5 MG TABLET: 37.5 | 30 days supply | Qty: 30 | Fill #0 | Status: TO

## 2017-05-29 NOTE — Progress Notes (Signed)
Subjective:    Patient ID: Stacie GripKristie D Mcallister, female    DOB: [birth year], 41 y.o.   MRN: [id number]  HPI presents for her wellness exam. No vaginal bleeding with IUD. Same sexual partner, but wants STD testing. Regular vision and dental exams. 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 Taking Diclofenac on a fairly regular basis for chronic knee and ankle pain. Has seen orthopedic in the past. No worsening pain. Has seen breakthrough reflux symptoms even with Omeprazole. On Weight Watchers. Would like to restart Phentermine. Has taken in the past without difficulty.     Review of Systems  Constitutional: Negative for activity change, appetite change and fatigue.  HENT: Positive for postnasal drip. Negative for dental problem, ear pain, sinus pressure and sore throat.   Respiratory: Negative for cough, chest tightness, shortness of breath and wheezing.   Cardiovascular: Negative for chest pain.  Gastrointestinal: Negative for abdominal distention, abdominal pain, constipation, diarrhea, nausea and vomiting.  Genitourinary: Negative for difficulty urinating, dysuria, enuresis, frequency, genital sores, pelvic pain, urgency, vaginal bleeding and vaginal discharge.  Musculoskeletal: Positive for arthralgias.       Objective:   Physical Exam  Constitutional: She is oriented to person, place, and time. She appears well-developed. No distress.  HENT:  Right Ear: External ear normal.  Left Ear: External ear normal.  Mouth/Throat: Oropharynx is clear and moist.  Neck: Normal range of motion. Neck supple. No tracheal deviation present. No thyromegaly present.  Cardiovascular: Normal rate, regular rhythm and normal heart sounds.  Exam reveals no gallop.   No murmur heard. Pulmonary/Chest: Effort normal and breath sounds normal.  Abdominal: Soft. She exhibits no distension and no mass. There is no tenderness. There is no rebound and no guarding.    Genitourinary: Vagina normal and uterus normal. No vaginal discharge found.  Genitourinary Comments: External GU: no rashes or lesions. Vagina: minimal white mucoid discharge. No CMT. Bimanual exam: no tenderness or masses noted.   Musculoskeletal: She exhibits no edema.  Lymphadenopathy:    She has no cervical adenopathy.  Neurological: She is alert and oriented to person, place, and time.  Skin: Skin is warm and dry. No rash noted.  Psychiatric: She has a normal mood and affect. Her behavior is normal.  Vitals reviewed. Breast exam: no masses; axillae no adenopathy.         Assessment & Plan:  Routine general medical examination at a health care facility - Plan: CBC with Differential/Platelet, Lipid panel, Hepatic function panel, TSH, VITAMIN D 25 Hydroxy (Vit-D Deficiency, Fractures), Hepatitis C Antibody, Basic metabolic panel, CANCELED: Basic metabolic panel  Screen for STD (sexually transmitted disease) - Plan: HIV antibody (with reflex), RPR, Chlamydia/Gonococcus/Trichomonas, NAA, CANCELED: Chlamydia/Gonococcus/Trichomonas, NAA  Morbid obesity (HCC)  Screening mammogram, encounter for - Plan: MM DIGITAL SCREENING BILATERAL  Meds ordered this encounter  Medications  . phentermine (ADIPEX-P) 37.5 MG tablet    Sig: Take 1 tablet (37.5 mg total) by mouth daily before breakfast.    Dispense:  30 tablet    Refill:  2    [facility] [place]    Order Specific Question:   Supervising Provider    Answer:   Merlyn AlbertLUKING, WILLIAM S [[id number]]  . meloxicam (MOBIC) 15 MG tablet    Sig: Take 1 tablet (15 mg total) by mouth daily.    Dispense:  90 tablet    Refill:  0    Order Specific Question:   Supervising  Provider    Answer:   Merlyn Albert [[id number]]  . pantoprazole (PROTONIX) 40 MG tablet    Sig: Take 1 tablet (40 mg total) by mouth daily.    Dispense:  90 tablet    Refill:  1    Order Specific Question:   Supervising Provider    Answer:   Merlyn Albert [[id number]]   Stop  Diclofenac and Omeprazole. Stop Mobic if GERD symptoms persist. Encouraged continued weight loss. Continue Weight Watchers.  Return in about 1 year (around 05/29/2018) for physical. Recheck in 3 months if she wishes to continue Phentermine.

## 2017-05-29 NOTE — Patient Instructions (Signed)
Stop Omeprazole and Diclofenac   Food Choices for Gastroesophageal Reflux Disease, Adult When you have gastroesophageal reflux disease (GERD), the foods you eat and your eating habits are very important. Choosing the right foods can help ease the discomfort of GERD. Consider working with a diet and nutrition specialist (dietitian) to help you make healthy food choices. What general guidelines should I follow? Eating plan  Choose healthy foods low in fat, such as fruits, vegetables, whole grains, low-fat dairy products, and lean meat, fish, and poultry.  Eat frequent, small meals instead of three large meals each day. Eat your meals slowly, in a relaxed setting. Avoid bending over or lying down until 2-3 hours after eating.  Limit high-fat foods such as fatty meats or fried foods.  Limit your intake of oils, butter, and shortening to less than 8 teaspoons each day.  Avoid the following: ? Foods that cause symptoms. These may be different for different people. Keep a food diary to keep track of foods that cause symptoms. ? Alcohol. ? Drinking large amounts of liquid with meals. ? Eating meals during the 2-3 hours before bed.  Cook foods using methods other than frying. This may include baking, grilling, or broiling. Lifestyle   Maintain a healthy weight. Ask your health care provider what weight is healthy for you. If you need to lose weight, work with your health care provider to do so safely.  Exercise for at least 30 minutes on 5 or more days each week, or as told by your health care provider.  Avoid wearing clothes that fit tightly around your waist and chest.  Do not use any products that contain nicotine or tobacco, such as cigarettes and e-cigarettes. If you need help quitting, ask your health care provider.  Sleep with the head of your bed raised. Use a wedge under the mattress or blocks under the bed frame to raise the head of the bed. What foods are not recommended? The  items listed may not be a complete list. Talk with your dietitian about what dietary choices are best for you. Grains Pastries or quick breads with added fat. [place] toast. Vegetables Deep fried vegetables. [place] fries. Any vegetables prepared with added fat. Any vegetables that cause symptoms. For some people this may include tomatoes and tomato products, chili peppers, onions and garlic, and horseradish. Fruits Any fruits prepared with added fat. Any fruits that cause symptoms. For some people this may include citrus fruits, such as oranges, grapefruit, pineapple, and lemons. Meats and other protein foods High-fat meats, such as fatty beef or pork, hot dogs, ribs, ham, sausage, salami and bacon. Fried meat or protein, including fried fish and fried chicken. Nuts and nut butters. Dairy Whole milk and chocolate milk. Sour cream. Cream. Ice cream. Cream cheese. Milk shakes. Beverages Coffee and tea, with or without caffeine. Carbonated beverages. Sodas. Energy drinks. Fruit juice made with acidic fruits (such as orange or grapefruit). Tomato juice. Alcoholic drinks. Fats and oils Butter. Margarine. Shortening. Ghee. Sweets and desserts Chocolate and cocoa. Donuts. Seasoning and other foods Pepper. Peppermint and spearmint. Any condiments, herbs, or seasonings that cause symptoms. For some people, this may include curry, hot sauce, or vinegar-based salad dressings. Summary  When you have gastroesophageal reflux disease (GERD), food and lifestyle choices are very important to help ease the discomfort of GERD.  Eat frequent, small meals instead of three large meals each day. Eat your meals slowly, in a relaxed setting. Avoid bending over or lying down until 2-3  hours after eating.  Limit high-fat foods such as fatty meat or fried foods. This information is not intended to replace advice given to you by your health care provider. Make sure you discuss any questions you have with your health  care provider. Document Released: 12/05/2005 Document Revised: 12/06/2016 Document Reviewed: 12/06/2016 Elsevier Interactive Patient Education  2017 [employer]

## 2017-05-30 LAB — CBC WITH DIFFERENTIAL/PLATELET
BASOS: 0 %
Basophils Absolute: 0 10*3/uL (ref 0.0–0.2)
EOS (ABSOLUTE): 0 10*3/uL (ref 0.0–0.4)
EOS: 0 %
HEMATOCRIT: 39.7 % (ref 34.0–46.6)
HEMOGLOBIN: 13.3 g/dL (ref 11.1–15.9)
Immature Grans (Abs): 0 10*3/uL (ref 0.0–0.1)
Immature Granulocytes: 0 %
LYMPHS ABS: 2.1 10*3/uL (ref 0.7–3.1)
Lymphs: 28 %
MCH: 30.7 pg (ref 26.6–33.0)
MCHC: 33.5 g/dL (ref 31.5–35.7)
MCV: 92 fL (ref 79–97)
MONOS ABS: 0.4 10*3/uL (ref 0.1–0.9)
Monocytes: 5 %
NEUTROS ABS: 5 10*3/uL (ref 1.4–7.0)
Neutrophils: 67 %
Platelets: 297 10*3/uL (ref 150–379)
RBC: 4.33 x10E6/uL (ref 3.77–5.28)
RDW: 12.9 % (ref 12.3–15.4)
WBC: 7.5 10*3/uL (ref 3.4–10.8)

## 2017-05-30 LAB — HEPATITIS C ANTIBODY: Hep C Virus Ab: <0.1 s/co ratio (ref 0.0–0.9)

## 2017-05-30 LAB — BASIC METABOLIC PANEL
BUN / CREAT RATIO: 10 (ref 9–23)
BUN: 9 mg/dL (ref 6–24)
CHLORIDE: 103 mmol/L (ref 96–106)
CO2: 26 mmol/L (ref 20–29)
Calcium: 8.9 mg/dL (ref 8.7–10.2)
Creatinine, Ser: 0.89 mg/dL (ref 0.57–1.00)
GFR, EST AFRICAN AMERICAN: 92 mL/min/{1.73_m2} (ref >59)
GFR, EST NON AFRICAN AMERICAN: 80 mL/min/{1.73_m2} (ref >59)
Glucose: 73 mg/dL (ref 65–99)
POTASSIUM: 4 mmol/L (ref 3.5–5.2)
SODIUM: 142 mmol/L (ref 134–144)

## 2017-05-30 LAB — LIPID PANEL
CHOL/HDL RATIO: 3.7 ratio (ref 0.0–4.4)
Cholesterol, Total: 147 mg/dL (ref 100–199)
HDL: 40 mg/dL (ref >39)
LDL CALC: 91 mg/dL (ref 0–99)
Triglycerides: 79 mg/dL (ref 0–149)
VLDL Cholesterol Cal: 16 mg/dL (ref 5–40)

## 2017-05-30 LAB — VITAMIN D 25 HYDROXY (VIT D DEFICIENCY, FRACTURES): VIT D 25 HYDROXY: 17.3 ng/mL — AB (ref 30.0–100.0)

## 2017-05-30 LAB — HEPATIC FUNCTION PANEL
ALT: 15 IU/L (ref 0–32)
AST: 14 IU/L (ref 0–40)
Albumin: 4.1 g/dL (ref 3.5–5.5)
Alkaline Phosphatase: 76 IU/L (ref 39–117)
BILIRUBIN TOTAL: 1.3 mg/dL — AB (ref 0.0–1.2)
BILIRUBIN, DIRECT: 0.27 mg/dL (ref 0.00–0.40)
TOTAL PROTEIN: 6.7 g/dL (ref 6.0–8.5)

## 2017-05-30 LAB — RPR: RPR: NONREACTIVE

## 2017-05-30 LAB — TSH: TSH: 0.547 u[IU]/mL (ref 0.450–4.500)

## 2017-05-30 LAB — HIV ANTIBODY (ROUTINE TESTING W REFLEX): HIV SCREEN 4TH GENERATION: NONREACTIVE

## 2017-05-31 ENCOUNTER — Other Ambulatory Visit: Payer: Self-pay | Admitting: Nurse Practitioner

## 2017-05-31 LAB — CHLAMYDIA/GONOCOCCUS/TRICHOMONAS, NAA
Chlamydia by NAA: NEGATIVE
Gonococcus by NAA: NEGATIVE
TRICH VAG BY NAA: NEGATIVE

## 2017-05-31 MED ORDER — VITAMIN D (ERGOCALCIFEROL) 1.25 MG (50000 UNIT) PO CAPS: 50000.0000 [IU] | ORAL_CAPSULE | ORAL | 0 refills | Status: DC

## 2017-06-12 ENCOUNTER — Other Ambulatory Visit: Payer: Self-pay | Admitting: Nurse Practitioner

## 2017-06-12 ENCOUNTER — Ambulatory Visit (HOSPITAL_COMMUNITY)
Admission: RE | Admit: 2017-06-12 | Discharge: 2017-06-12 | Disposition: A | Discharge status: Home / Self Care | Payer: 59 | Source: Ambulatory Visit | Attending: Nurse Practitioner | Admitting: Nurse Practitioner

## 2017-06-12 DIAGNOSIS — R928 Other abnormal and inconclusive findings on diagnostic imaging of breast: Secondary | ICD-10-CM | POA: Insufficient documentation

## 2017-06-12 DIAGNOSIS — Z1231 Encounter for screening mammogram for malignant neoplasm of breast: Secondary | ICD-10-CM | POA: Insufficient documentation

## 2017-06-13 ENCOUNTER — Other Ambulatory Visit: Payer: Self-pay | Admitting: Nurse Practitioner

## 2017-06-13 DIAGNOSIS — R928 Other abnormal and inconclusive findings on diagnostic imaging of breast: Secondary | ICD-10-CM

## 2017-06-20 ENCOUNTER — Ambulatory Visit (HOSPITAL_COMMUNITY)
Admission: RE | Admit: 2017-06-20 | Discharge: 2017-06-20 | Disposition: A | Discharge status: Home / Self Care | Payer: 59 | Source: Ambulatory Visit | Attending: Nurse Practitioner | Admitting: Nurse Practitioner

## 2017-06-20 DIAGNOSIS — R928 Other abnormal and inconclusive findings on diagnostic imaging of breast: Secondary | ICD-10-CM | POA: Insufficient documentation

## 2017-07-31 ENCOUNTER — Encounter: Payer: Self-pay | Admitting: Nurse Practitioner

## 2017-08-01 ENCOUNTER — Other Ambulatory Visit: Payer: Self-pay | Admitting: Nurse Practitioner

## 2017-08-01 MED ORDER — DICLOFENAC SODIUM 75 MG PO TBEC: 75.0000 mg | DELAYED_RELEASE_TABLET | Freq: Two times a day (BID) | ORAL | 0 refills | Status: DC

## 2017-08-01 MED ORDER — OMEPRAZOLE 40 MG PO CPDR: 40.0000 mg | DELAYED_RELEASE_CAPSULE | Freq: Every day | ORAL | 2 refills | Status: DC

## 2017-09-03 ENCOUNTER — Other Ambulatory Visit: Payer: Self-pay | Admitting: Nurse Practitioner

## 2017-09-19 ENCOUNTER — Other Ambulatory Visit: Payer: Self-pay | Admitting: *Deleted

## 2017-09-19 MED ORDER — OMEPRAZOLE 40 MG PO CPDR: 40.0000 mg | DELAYED_RELEASE_CAPSULE | Freq: Every day | ORAL | 0 refills | Status: DC

## 2017-09-28 ENCOUNTER — Telehealth: Payer: Self-pay

## 2017-09-28 NOTE — Telephone Encounter (Signed)
[facility] sent fax requesting Diclofenac 75 mg one po BID #[id number]. Pt last had filled 09/04/2017 at # 60 tablets. Please advise.thanks,CS

## 2017-09-29 ENCOUNTER — Other Ambulatory Visit: Payer: Self-pay | Admitting: *Deleted

## 2017-09-29 MED ORDER — DICLOFENAC SODIUM 75 MG PO TBEC: 75.0000 mg | DELAYED_RELEASE_TABLET | Freq: Two times a day (BID) | ORAL | 0 refills | Status: DC

## 2017-10-19 ENCOUNTER — Other Ambulatory Visit: Payer: Self-pay | Admitting: *Deleted

## 2017-10-19 MED ORDER — OMEPRAZOLE 40 MG PO CPDR: 40.0000 mg | DELAYED_RELEASE_CAPSULE | Freq: Every day | ORAL | 0 refills | Status: DC

## 2017-12-03 ENCOUNTER — Other Ambulatory Visit: Payer: Self-pay | Admitting: Nurse Practitioner

## 2018-03-09 IMAGING — MG DIGITAL SCREENING BILATERAL MAMMOGRAM WITH CAD
5 series · 5 of 5 positions shown · non-contrast
Comparison: Previous exam(s).

CLINICAL DATA: Screening.

EXAM:
DIGITAL SCREENING BILATERAL MAMMOGRAM WITH CAD

[L CC]
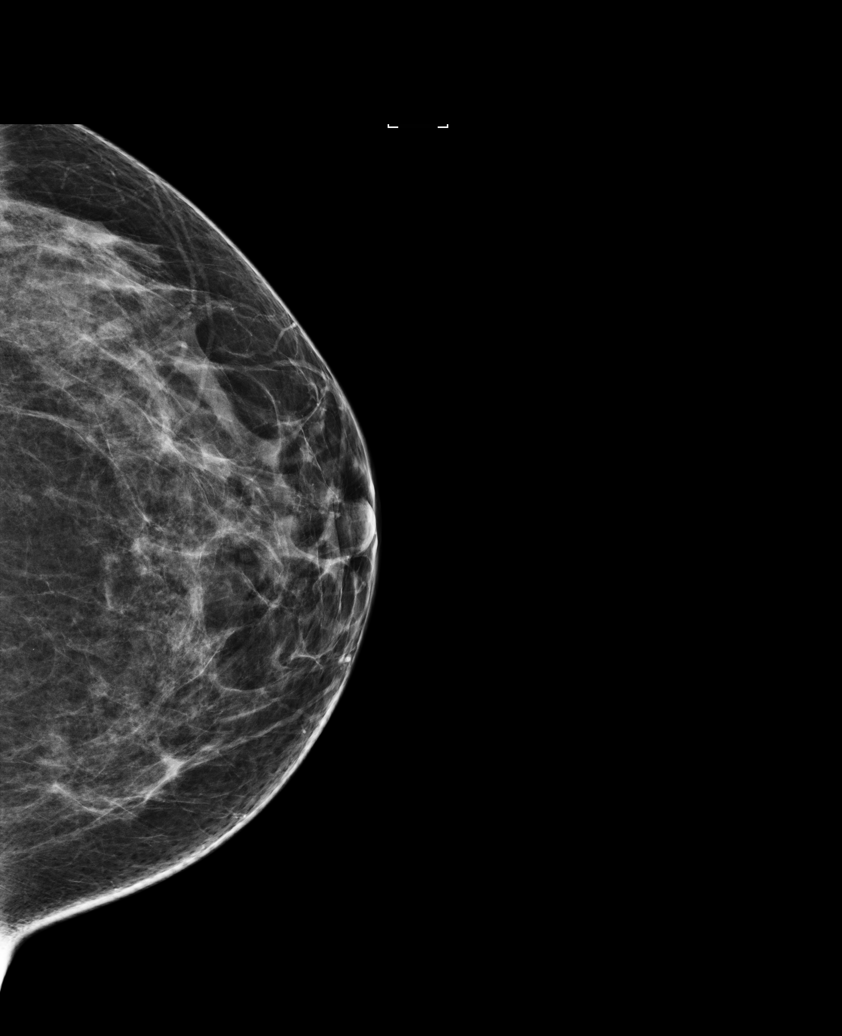

[L MLO]
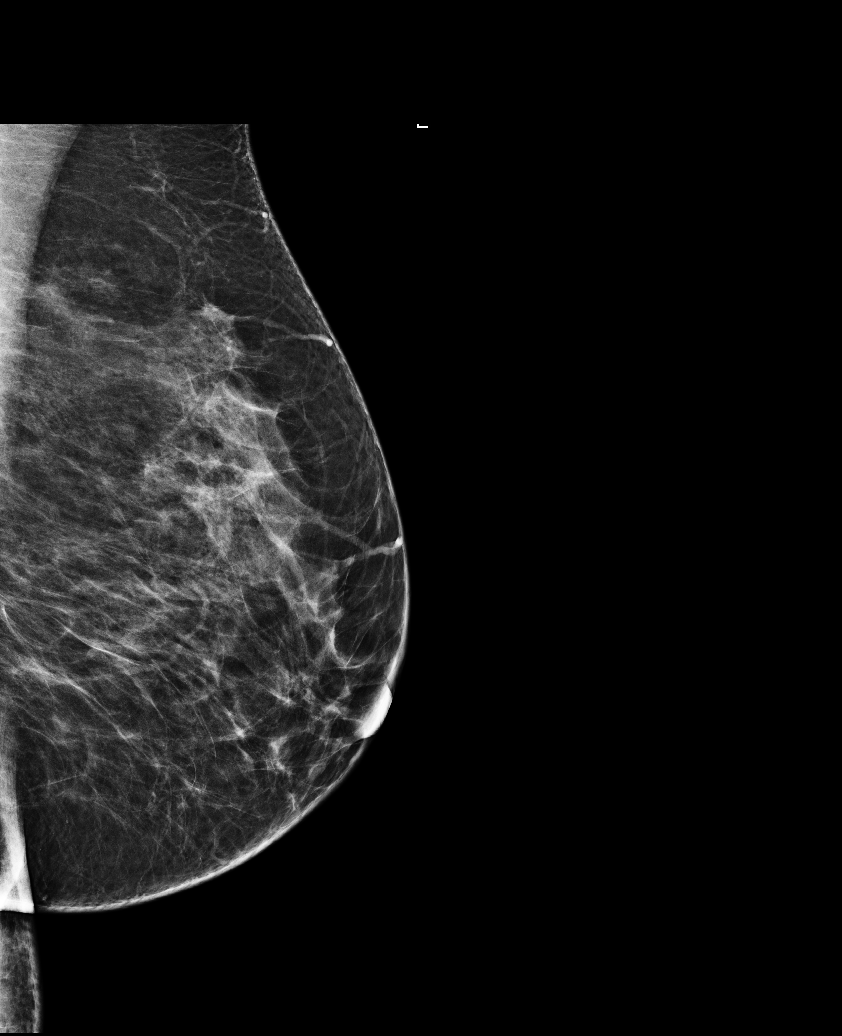

[R MLO (1 of 2)]
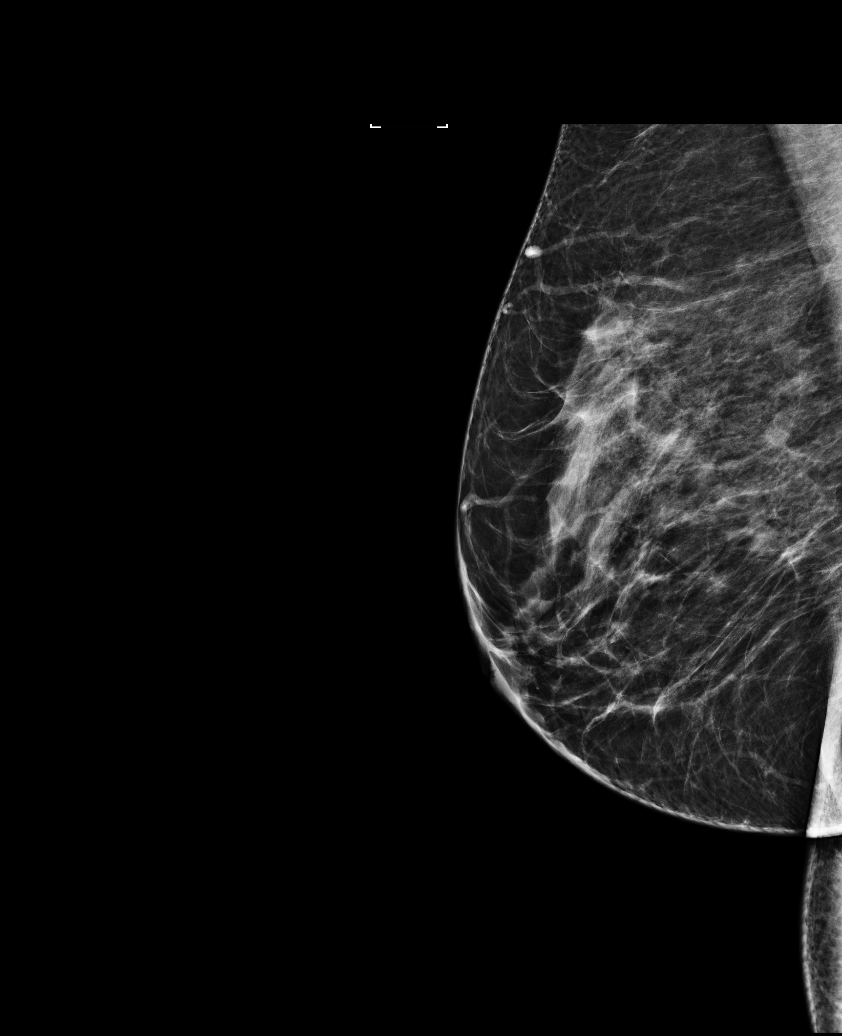

[R MLO (2 of 2)]
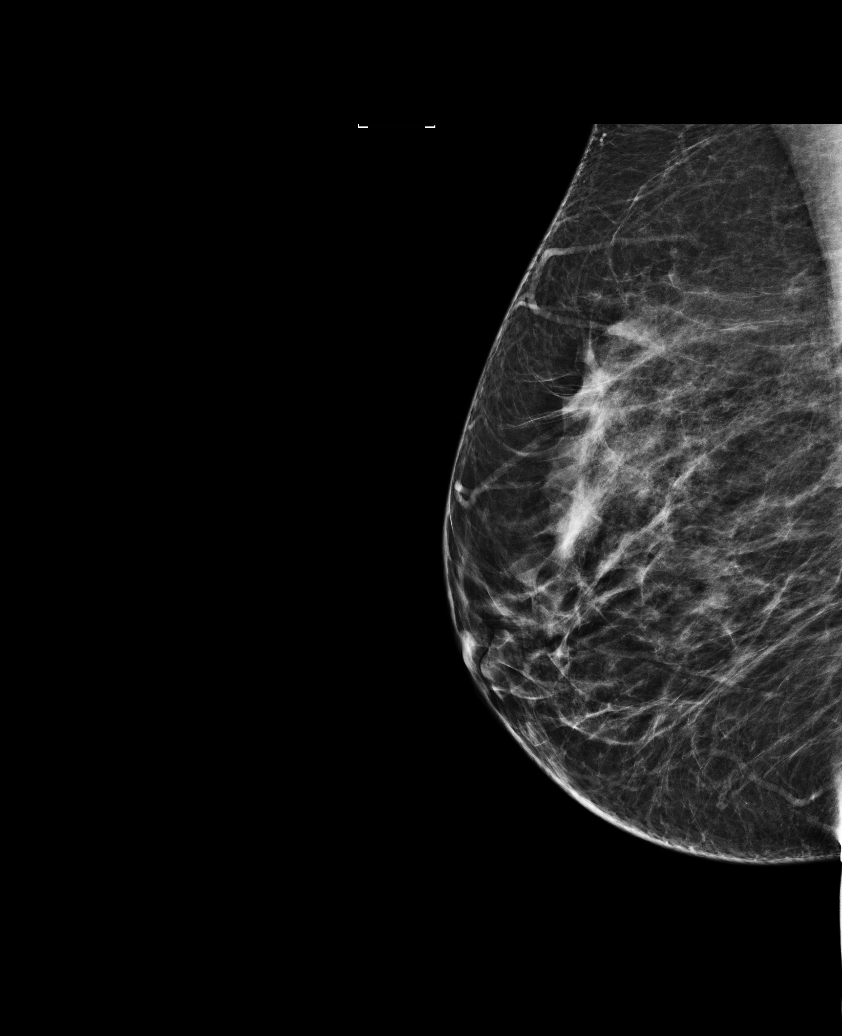

[R CC]
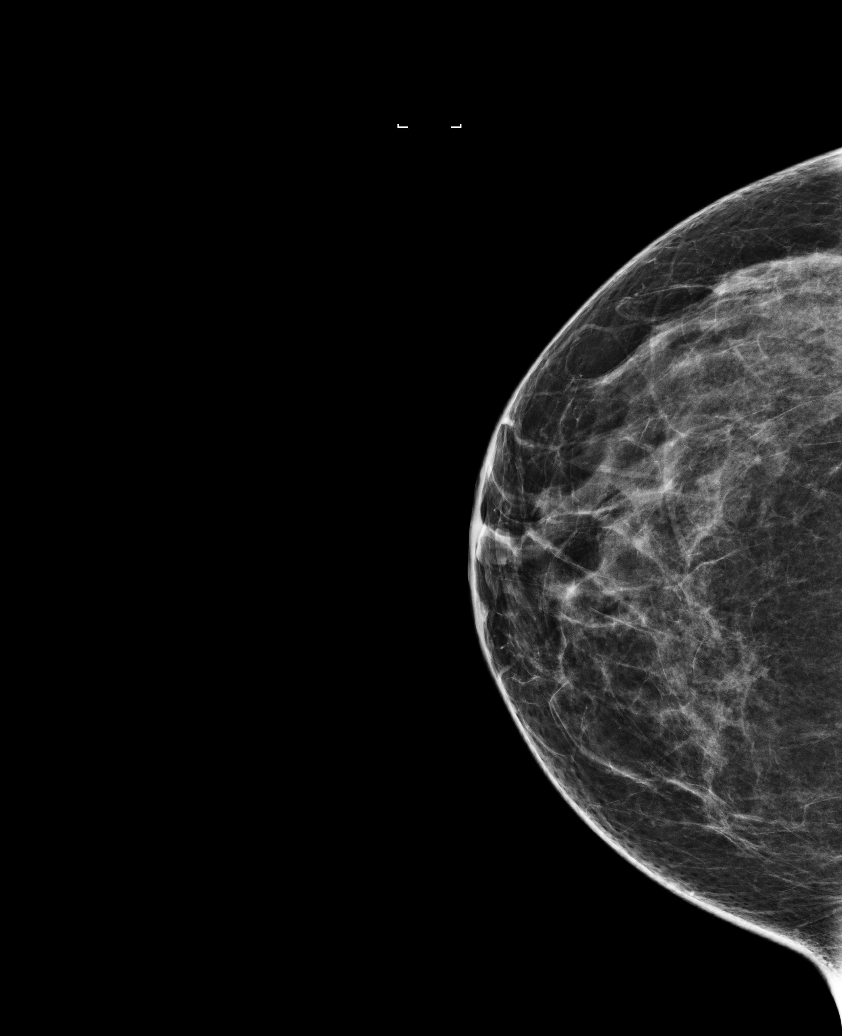

[5 of 5 positions shown; findings below may reference images not displayed]

ACR Breast Density Category c: The breast tissue is heterogeneously
dense, which may obscure small masses.
FINDINGS: In the left breast, a possible asymmetry warrants further
evaluation. In the right breast, no findings suspicious for
malignancy. Images were processed with CAD.
IMPRESSION: Further evaluation is suggested for possible asymmetry in the left
breast.

RECOMMENDATION:
Diagnostic mammogram and possibly ultrasound of the left breast.
(Code:P3-X-WW1)

The patient will be contacted regarding the findings, and additional
imaging will be scheduled.

BI-RADS CATEGORY  0: Incomplete. Need additional imaging evaluation
and/or prior mammograms for comparison.

## 2018-03-12 ENCOUNTER — Other Ambulatory Visit: Payer: Self-pay | Admitting: *Deleted

## 2018-03-12 ENCOUNTER — Telehealth: Payer: Self-pay

## 2018-03-12 MED ORDER — DICLOFENAC SODIUM 75 MG PO TBEC: 75.0000 mg | DELAYED_RELEASE_TABLET | Freq: Two times a day (BID) | ORAL | 0 refills | Status: DC

## 2018-03-12 NOTE — Telephone Encounter (Signed)
Ok three ref 

## 2018-03-12 NOTE — Telephone Encounter (Signed)
Pt is requesting diclofenac 75 mg BID # [id number]. [facility] [address] [place] [id number]. Last wellness 05/29/2017

## 2018-03-12 NOTE — Telephone Encounter (Signed)
Refill sent to pharm

## 2018-04-20 ENCOUNTER — Other Ambulatory Visit: Payer: Self-pay | Admitting: Family Medicine

## 2018-04-20 ENCOUNTER — Encounter: Payer: Self-pay | Admitting: Nurse Practitioner

## 2018-04-23 ENCOUNTER — Other Ambulatory Visit: Payer: Self-pay | Admitting: Nurse Practitioner

## 2018-04-23 MED ORDER — DICLOFENAC SODIUM 75 MG PO TBEC: 75.0000 mg | DELAYED_RELEASE_TABLET | Freq: Two times a day (BID) | ORAL | 1 refills | Status: DC

## 2018-04-23 MED ORDER — IBUPROFEN 800 MG PO TABS: 800.0000 mg | ORAL_TABLET | Freq: Three times a day (TID) | ORAL | 0 refills | Status: DC | PRN

## 2018-04-23 MED ORDER — OMEPRAZOLE 40 MG PO CPDR: 40.0000 mg | DELAYED_RELEASE_CAPSULE | Freq: Every day | ORAL | 0 refills | Status: DC

## 2018-04-23 NOTE — Telephone Encounter (Signed)
Nurses please communicate with patient-I saw a request for ibuprofen 800 mg and a request for this medicine both of these medicines are anti-inflammatories and should not be prescribed together.  It is important for her to be on one or the other not both please clarify with patient

## 2018-04-23 NOTE — Telephone Encounter (Signed)
Spoke with patient. States she knows she is to not take both of them together. She states she takes the Voltaren twice a day and the ibuprofen sometimes at night, not all the time.

## 2018-04-29 ENCOUNTER — Telehealth: Payer: Self-pay | Admitting: Family Medicine

## 2018-04-29 NOTE — Telephone Encounter (Signed)
Patient needs to know that her diclofenac should be twice daily as directed.  When she does use ibuprofen she should not take diclofenac within 24 hours of the ibuprofen.  Secondly in the future we will not be able to utilize the 800 mg ibuprofen.  I would recommend she use over-the-counter ibuprofen 200 mg tablet in order to be safe I would recommend no more than 3 at a time and finally I would state as above if she does take ibuprofen do not take diclofenac within 24 hours.  From a medical perspective we cannot prescribe prescription strength anti-inflammatory ibuprofen along with the diclofenac.  This creates too many flags with insurance company stating that this is not the proper medical course for patient.  Therefore after discussing with the patient if she desires to stay on the diclofenac we will be removing the prescription strength ibuprofen 800 mg from her epic list.  It would be fine to put on the epic list OTC ibuprofen with statement not to take within 24 hours of diclofenac.  Please document accordingly.

## 2018-04-30 NOTE — Telephone Encounter (Signed)
Left message to return call 

## 2018-05-08 ENCOUNTER — Other Ambulatory Visit: Payer: Self-pay

## 2018-05-08 MED ORDER — IBUPROFEN 200 MG PO CAPS: 200.0000 mg | ORAL_CAPSULE | ORAL | 0 refills | Status: DC | PRN

## 2018-05-08 NOTE — Telephone Encounter (Signed)
Spoke with patient. She would like to stay on diclofenac. Pt verbalized understanding to not take IBU within 24 hours of diclofenac. IBU 800 removed from med list and 200 mg OTC IBU added.

## 2018-05-29 ENCOUNTER — Encounter: Payer: Self-pay | Admitting: Nurse Practitioner

## 2018-06-25 ENCOUNTER — Encounter: Payer: Self-pay | Admitting: Nurse Practitioner

## 2018-06-25 ENCOUNTER — Ambulatory Visit (INDEPENDENT_AMBULATORY_CARE_PROVIDER_SITE_OTHER): Payer: Managed Care, Other (non HMO) | Admitting: Nurse Practitioner

## 2018-06-25 VITALS — BP 136/88 | Ht 68.0 in | Wt 253.0 lb

## 2018-06-25 DIAGNOSIS — Z124 Encounter for screening for malignant neoplasm of cervix: Secondary | ICD-10-CM

## 2018-06-25 DIAGNOSIS — Z01419 Encounter for gynecological examination (general) (routine) without abnormal findings: Secondary | ICD-10-CM

## 2018-06-25 DIAGNOSIS — Z113 Encounter for screening for infections with a predominantly sexual mode of transmission: Secondary | ICD-10-CM

## 2018-06-25 DIAGNOSIS — Z0000 Encounter for general adult medical examination without abnormal findings: Secondary | ICD-10-CM | POA: Diagnosis not present

## 2018-06-25 DIAGNOSIS — Z1151 Encounter for screening for human papillomavirus (HPV): Secondary | ICD-10-CM

## 2018-06-25 DIAGNOSIS — Z1231 Encounter for screening mammogram for malignant neoplasm of breast: Secondary | ICD-10-CM

## 2018-06-25 MED ORDER — PHENTERMINE HCL 37.5 MG PO TABS: 37.5000 mg | ORAL_TABLET | Freq: Every day | ORAL | 2 refills | Status: DC

## 2018-06-25 NOTE — Patient Instructions (Signed)
[facility] in [place]

## 2018-06-26 ENCOUNTER — Encounter: Payer: Self-pay | Admitting: Nurse Practitioner

## 2018-06-26 NOTE — Progress Notes (Signed)
Subjective:    Patient ID: Stacie GripKristie D Roberts, female    DOB: 11-Oct-1976, 42 y.o.   MRN: [id number]  HPI presents her wellness exam. Has IUD, no cycle. Same sexual partner. Requests routine STD screening. No symptoms. Regular vision and dental exams. Has had teeth removed and partial plate on top. Takes Omeprazole in the mornings but reflux is worse at night. Has to take Diclofenac for chronic orthopedic pain which works well. Walking for exercise. Has struggled with weight. Would like to restart Phentermine which she has taken fine in the past. Would also like to consider Saxenda if insurance will cover.      Review of Systems  Constitutional: Negative for activity change, appetite change and fatigue.  HENT: Negative for dental problem, ear pain, sinus pressure and sore throat.   Respiratory: Negative for cough, chest tightness, shortness of breath and wheezing.   Cardiovascular: Negative for chest pain.  Gastrointestinal: Negative for abdominal distention, abdominal pain, blood in stool, constipation, diarrhea, nausea and vomiting.  Genitourinary: Negative for difficulty urinating, dysuria, enuresis, frequency, genital sores, menstrual problem, pelvic pain, urgency, vaginal bleeding and vaginal discharge.   Depression screen [facility] 2/9 06/25/2018 05/29/2017  Decreased Interest 0 0  Down, Depressed, Hopeless 0 0  PHQ - 2 Score 0 0        Objective:   Physical Exam  Constitutional: She is oriented to person, place, and time. She appears well-developed. No distress.  HENT:  Right Ear: External ear normal.  Left Ear: External ear normal.  Mouth/Throat: Oropharynx is clear and moist.  Neck: Normal range of motion. Neck supple. No tracheal deviation present. No thyromegaly present.  Cardiovascular: Normal rate, regular rhythm and normal heart sounds. Exam reveals no gallop.  No murmur heard. Pulmonary/Chest: Effort normal and breath sounds normal. Right breast exhibits no inverted nipple, no  mass, no skin change and no tenderness. Left breast exhibits no inverted nipple, no mass, no skin change and no tenderness. Breasts are symmetrical.  Abdominal: Soft. She exhibits no distension. There is no tenderness.  Genitourinary: Vagina normal and uterus normal. No vaginal discharge found.  Genitourinary Comments: External GU: no rashes or lesions. Vagina: no discharge. Cervix normal in appearance. No CMT. Bimanual exam: no tenderness or obvious masses. Exam limited due to abdominal girth.   Musculoskeletal: She exhibits no edema.  Lymphadenopathy:    She has no cervical adenopathy.  Neurological: She is alert and oriented to person, place, and time.  Skin: Skin is warm and dry. No rash noted.  Psychiatric: She has a normal mood and affect. Her behavior is normal.          Assessment & Plan:   Problem List Items Addressed This Visit      Other   Morbid obesity (HCC) (Chronic)   Relevant Medications   phentermine (ADIPEX-P) 37.5 MG tablet    Other Visit Diagnoses    Well woman exam    -  Primary   Relevant Orders   Pap IG, CT/NG NAA, and HPV (high risk)   MM Digital Screening   Basic Metabolic Panel (BMET)   Hepatitis C Antibody   HIV antibody (with reflex)   Vitamin D (25 hydroxy)   Lipid Profile   Hepatic function panel   Screening for cervical cancer       Relevant Orders   Pap IG, CT/NG NAA, and HPV (high risk)   Screening for HPV (human papillomavirus)       Relevant Orders   Pap  IG, CT/NG NAA, and HPV (high risk)   Screen for STD (sexually transmitted disease)       Relevant Orders   Pap IG, CT/NG NAA, and HPV (high risk)   Screening mammogram, encounter for       Relevant Orders   MM Digital Screening     Meds ordered this encounter  Medications  . phentermine (ADIPEX-P) 37.5 MG tablet    Sig: Take 1 tablet (37.5 mg total) by mouth daily before breakfast.    Dispense:  30 tablet    Refill:  2    [facility]    Order Specific Question:    Supervising Provider    Answer:   Merlyn Albert [[id number]]   Encouraged regular exercise and healthy diet. Restart Phentermine as directed. DC med and contact office if any problems. May consider Saxenda if insurance will cover medication.  Return in about 1 year (around 06/26/2019) for physical.

## 2018-06-27 ENCOUNTER — Other Ambulatory Visit: Payer: Self-pay | Admitting: Nurse Practitioner

## 2018-06-27 MED ORDER — LIRAGLUTIDE -WEIGHT MANAGEMENT 18 MG/3ML ~~LOC~~ SOPN: 0.6000 mg | PEN_INJECTOR | Freq: Every day | SUBCUTANEOUS | 0 refills | Status: DC

## 2018-06-28 ENCOUNTER — Other Ambulatory Visit: Payer: Self-pay | Admitting: *Deleted

## 2018-06-28 LAB — PAP IG, CT-NG NAA, HPV HIGH-RISK
Chlamydia, Nuc. Acid Amp: NEGATIVE
Gonococcus by Nucleic Acid Amp: NEGATIVE
HPV, high-risk: NEGATIVE
PAP SMEAR COMMENT: 0

## 2018-06-28 MED ORDER — LIRAGLUTIDE -WEIGHT MANAGEMENT 18 MG/3ML ~~LOC~~ SOPN: 0.6000 mg | PEN_INJECTOR | Freq: Every day | SUBCUTANEOUS | 0 refills | Status: DC

## 2018-06-29 LAB — HEPATIC FUNCTION PANEL
ALT: 21 [IU]/L (ref 0–32)
AST: 18 [IU]/L (ref 0–40)
Albumin: 4.1 g/dL (ref 3.5–5.5)
Alkaline Phosphatase: 75 [IU]/L (ref 39–117)
Bilirubin Total: 0.8 mg/dL (ref 0.0–1.2)
Bilirubin, Direct: 0.18 mg/dL (ref 0.00–0.40)
Total Protein: 6.8 g/dL (ref 6.0–8.5)

## 2018-06-29 LAB — BASIC METABOLIC PANEL WITH GFR
BUN/Creatinine Ratio: 12 (ref 9–23)
BUN: 11 mg/dL (ref 6–24)
CO2: 21 mmol/L (ref 20–29)
Calcium: 9 mg/dL (ref 8.7–10.2)
Chloride: 104 mmol/L (ref 96–106)
Creatinine, Ser: 0.89 mg/dL (ref 0.57–1.00)
GFR calc Af Amer: 92 mL/min/{1.73_m2}
GFR calc non Af Amer: 80 mL/min/{1.73_m2}
Glucose: 91 mg/dL (ref 65–99)
Potassium: 4.4 mmol/L (ref 3.5–5.2)
Sodium: 139 mmol/L (ref 134–144)

## 2018-06-29 LAB — LIPID PANEL
Chol/HDL Ratio: 4.8 ratio — ABNORMAL HIGH (ref 0.0–4.4)
Cholesterol, Total: 171 mg/dL (ref 100–199)
HDL: 36 mg/dL — ABNORMAL LOW
LDL Calculated: 112 mg/dL — ABNORMAL HIGH (ref 0–99)
Triglycerides: 113 mg/dL (ref 0–149)
VLDL Cholesterol Cal: 23 mg/dL (ref 5–40)

## 2018-06-29 LAB — HEPATITIS C ANTIBODY: Hep C Virus Ab: <0.1 {s_co_ratio} (ref 0.0–0.9)

## 2018-06-29 LAB — HIV ANTIBODY (ROUTINE TESTING W REFLEX): HIV Screen 4th Generation wRfx: NONREACTIVE

## 2018-06-29 LAB — VITAMIN D 25 HYDROXY (VIT D DEFICIENCY, FRACTURES): Vit D, 25-Hydroxy: 21.9 ng/mL — ABNORMAL LOW (ref 30.0–100.0)

## 2018-07-02 ENCOUNTER — Other Ambulatory Visit: Payer: Self-pay | Admitting: Nurse Practitioner

## 2018-07-04 ENCOUNTER — Telehealth: Payer: Self-pay | Admitting: Nurse Practitioner

## 2018-07-04 ENCOUNTER — Other Ambulatory Visit: Payer: Self-pay | Admitting: Nurse Practitioner

## 2018-07-04 MED ORDER — VITAMIN D (ERGOCALCIFEROL) 1.25 MG (50000 UNIT) PO CAPS: 50000.0000 [IU] | ORAL_CAPSULE | ORAL | 2 refills | Status: DC

## 2018-07-04 NOTE — Telephone Encounter (Signed)
Please let her know her PA was denied.

## 2018-07-04 NOTE — Telephone Encounter (Signed)
PA for Saxenda was denied due to clinical and/or treatment info provided did not meet medical necessity based on terms of patient benefit plan and [facility] Clinical Policy Bulletin. Preferred formulary alternatives incluede Belviq, Qsymia, Contrave, Regimex, Diethylpropion, phendimetrazine, or phentermine. Left message to return call to inform patient.

## 2018-07-05 ENCOUNTER — Telehealth: Payer: Self-pay | Admitting: Nurse Practitioner

## 2018-07-05 ENCOUNTER — Other Ambulatory Visit: Payer: Self-pay | Admitting: *Deleted

## 2018-07-05 MED ORDER — VITAMIN D (ERGOCALCIFEROL) 1.25 MG (50000 UNIT) PO CAPS: 50000.0000 [IU] | ORAL_CAPSULE | ORAL | 2 refills | Status: DC

## 2018-07-05 NOTE — Telephone Encounter (Signed)
Patient had Rx for Vitamin D, Ergocalciferol, (DRISDOL) 50000 units CAPS capsule called in yesterday.  It was sent to the wrong pharmacy.  [facility] [address] [facility] [place], [place]

## 2018-07-05 NOTE — Telephone Encounter (Signed)
Called [facility] in [place] and canceled rx. Sent to correct pharm. Pt notified on voicemail.

## 2018-07-06 ENCOUNTER — Encounter: Payer: Self-pay | Admitting: Nurse Practitioner

## 2018-07-20 ENCOUNTER — Ambulatory Visit
Admission: RE | Admit: 2018-07-20 | Discharge: 2018-07-20 | Disposition: A | Discharge status: Home / Self Care | Payer: Managed Care, Other (non HMO) | Source: Ambulatory Visit | Attending: Nurse Practitioner | Admitting: Nurse Practitioner

## 2018-07-27 ENCOUNTER — Other Ambulatory Visit: Payer: Self-pay | Admitting: Family Medicine

## 2018-09-17 ENCOUNTER — Telehealth: Payer: Self-pay | Admitting: Family Medicine

## 2018-09-17 NOTE — Telephone Encounter (Signed)
Patient states she works for [employer] and is going to check to see what they cover and give [place] a call back.

## 2018-09-17 NOTE — Telephone Encounter (Signed)
Pharmacy stated that Insurance will only cover a 90 day supply in 365 days so it would require prior auth for continuing therapy due to quantity limitations o her plan  Please advise

## 2018-09-17 NOTE — Telephone Encounter (Signed)
If patient states he needs a medication in order to keep her symptoms and check Try to get the prior authorization If there are other medicines that are covered by her insurance plan let me know It is important to let the patient know that it is her insurance company that is causing this issue not [place]

## 2018-09-17 NOTE — Telephone Encounter (Signed)
Pt is checking on the status of her PA on omeprazole (PRILOSEC) 40 MG capsule. She is now out of the medication and the pharmacy told her they faxed [place] regarding the PA last week.

## 2018-09-18 NOTE — Telephone Encounter (Signed)
Prior authorization obtained from [employer]. Approval good for 09/18/18-09/19/19. Patient and pharmacy notified.

## 2018-09-24 ENCOUNTER — Other Ambulatory Visit: Payer: Self-pay

## 2018-09-24 MED ORDER — VITAMIN D (ERGOCALCIFEROL) 1.25 MG (50000 UNIT) PO CAPS: 50000.0000 [IU] | ORAL_CAPSULE | ORAL | 2 refills | Status: DC

## 2018-11-30 ENCOUNTER — Other Ambulatory Visit: Payer: Self-pay | Admitting: Family Medicine

## 2018-12-01 NOTE — Telephone Encounter (Signed)
I would recommend lab work and follow-up office visit before refilling this Specifically vitamin D level Therefore I would refuse this refill request and have patient contact our office

## 2018-12-07 ENCOUNTER — Other Ambulatory Visit: Payer: Self-pay | Admitting: Family Medicine

## 2019-03-10 ENCOUNTER — Other Ambulatory Visit: Payer: Self-pay | Admitting: Family Medicine

## 2019-03-11 ENCOUNTER — Other Ambulatory Visit: Payer: Self-pay | Admitting: Family Medicine

## 2019-03-11 ENCOUNTER — Encounter: Payer: Self-pay | Admitting: Family Medicine

## 2019-05-27 ENCOUNTER — Other Ambulatory Visit: Payer: Self-pay | Admitting: Nurse Practitioner

## 2019-05-27 ENCOUNTER — Ambulatory Visit (INDEPENDENT_AMBULATORY_CARE_PROVIDER_SITE_OTHER): Payer: PRIVATE HEALTH INSURANCE | Admitting: Nurse Practitioner

## 2019-05-27 ENCOUNTER — Other Ambulatory Visit: Payer: Self-pay

## 2019-05-27 ENCOUNTER — Encounter: Payer: Self-pay | Admitting: Nurse Practitioner

## 2019-05-27 VITALS — BP 138/86 | Temp 97.4°F | Ht 67.0 in | Wt 250.6 lb

## 2019-05-27 DIAGNOSIS — Z1211 Encounter for screening for malignant neoplasm of colon: Secondary | ICD-10-CM

## 2019-05-27 DIAGNOSIS — B9689 Other specified bacterial agents as the cause of diseases classified elsewhere: Secondary | ICD-10-CM

## 2019-05-27 DIAGNOSIS — Z1159 Encounter for screening for other viral diseases: Secondary | ICD-10-CM

## 2019-05-27 DIAGNOSIS — Z114 Encounter for screening for human immunodeficiency virus [HIV]: Secondary | ICD-10-CM | POA: Diagnosis not present

## 2019-05-27 DIAGNOSIS — N760 Acute vaginitis: Secondary | ICD-10-CM | POA: Diagnosis not present

## 2019-05-27 DIAGNOSIS — Z113 Encounter for screening for infections with a predominantly sexual mode of transmission: Secondary | ICD-10-CM | POA: Diagnosis not present

## 2019-05-27 DIAGNOSIS — Z0000 Encounter for general adult medical examination without abnormal findings: Secondary | ICD-10-CM

## 2019-05-27 DIAGNOSIS — Z01419 Encounter for gynecological examination (general) (routine) without abnormal findings: Secondary | ICD-10-CM

## 2019-05-27 LAB — POCT WET PREP WITH KOH
KOH Prep POC: NEGATIVE
RBC Wet Prep HPF POC: NEGATIVE
Trichomonas, UA: NEGATIVE
pH, Wet Prep: 5.5

## 2019-05-27 MED ORDER — LIRAGLUTIDE -WEIGHT MANAGEMENT 18 MG/3ML ~~LOC~~ SOPN: 0.6000 mg | PEN_INJECTOR | Freq: Every day | SUBCUTANEOUS | 0 refills | Status: DC

## 2019-05-27 MED ORDER — METRONIDAZOLE 500 MG PO TABS: 500.0000 mg | ORAL_TABLET | Freq: Two times a day (BID) | ORAL | 0 refills | Status: DC

## 2019-05-27 NOTE — Progress Notes (Signed)
Subjective:    Patient ID: Stacie Roberts, female    DOB: Nov 19, 1976, 43 y.o.   MRN: [id number]  HPI The patient comes in today for a wellness visit.    A review of their health history was completed.  A review of medications was also completed.  Any needed refills; has been on Phentermine which is not working as well. Has switched insurance. Would like to see if she can get Saxenda covered. Was unable to take before due to cost.   Eating habits: eats well   Falls/  MVA accidents in past few months: none  Regular exercise: walks at the park   Specialist pt sees on regular basis: none  Preventative health issues were discussed.   Additional concerns: none   Review of Systems  Constitutional: Negative for activity change, appetite change and fatigue.  HENT: Negative for dental problem, ear pain, sinus pressure and sore throat.   Respiratory: Negative for cough, chest tightness, shortness of breath and wheezing.   Cardiovascular: Negative for chest pain.  Gastrointestinal: Negative for abdominal distention, abdominal pain, blood in stool, constipation, diarrhea, nausea and vomiting.  Genitourinary: Negative for difficulty urinating, dysuria, enuresis, frequency, genital sores, menstrual problem, pelvic pain, urgency, vaginal bleeding and vaginal discharge.    Regular walking for activity. Has Mirena IUD. No bleeding. Same sexual partner x 1 year but would like STI testing to be safe. Strong family history of CA particularly colon CA. Last colonoscopy was 2007. Overdue for repeat. Had Tdap through employer. Regular vision and dental exams.      Objective:   Physical Exam Constitutional:      General: She is not in acute distress.    Appearance: She is well-developed.  Neck:     Musculoskeletal: Normal range of motion and neck supple.     Thyroid: No thyromegaly.     Trachea: No tracheal deviation.     Comments: Thyroid non tender, no mass or goiter noted.   Cardiovascular:     Rate and Rhythm: Normal rate and regular rhythm.     Heart sounds: Normal heart sounds. No murmur. No gallop.   Pulmonary:     Effort: Pulmonary effort is normal.     Breath sounds: Normal breath sounds.  Chest:     Breasts: Breasts are symmetrical.        Right: Inverted nipple present. No mass, nipple discharge, skin change or tenderness.        Left: Normal. No inverted nipple, mass, nipple discharge, skin change or tenderness.  Abdominal:     General: There is no distension.     Palpations: Abdomen is soft.     Tenderness: There is no abdominal tenderness.  Genitourinary:    Vagina: Normal. No vaginal discharge.     Comments: External GU: no rash or lesions noted. Vagina: small amount of white to slightly yellowish mucoid discharge noted. No CMT. Bimanual exam: no tenderness or obvious masses; exam limited due to abd girth.  Lymphadenopathy:     Cervical: No cervical adenopathy.  Skin:    General: Skin is warm and dry.     Findings: No rash.  Neurological:     Mental Status: She is alert and oriented to person, place, and time.  Psychiatric:        Mood and Affect: Mood normal.        Behavior: Behavior normal.        Thought Content: Thought content normal.  Judgment: Judgment normal.    Chronic inversion of right nipple. Axillae no adenopathy.   Results for orders placed or performed in visit on 05/27/19  POCT Wet Prep with KOH  Result Value Ref Range   Trichomonas, UA Negative    Clue Cells Wet Prep HPF POC many    Epithelial Wet Prep HPF POC     Yeast Wet Prep HPF POC     Bacteria Wet Prep HPF POC Few Few   RBC Wet Prep HPF POC neg    WBC Wet Prep HPF POC occas    KOH Prep POC Negative Negative   pH, Wet Prep 5.5         Assessment & Plan:   Problem List Items Addressed This Visit      Other   Morbid obesity (HCC) (Chronic)   Relevant Medications   Liraglutide -Weight Management (SAXENDA) 18 MG/3ML SOPN    Other Visit  Diagnoses    Well woman exam with routine gynecological exam    -  Primary   Relevant Orders   Lipid Profile   COMPLETE METABOLIC PANEL WITH GFR   HIV antibody (with reflex)   Hepatitis C Antibody   RPR   Vitamin D (25 hydroxy)   COMPLETE METABOLIC PANEL WITH GFR   Ambulatory referral to Gastroenterology   Bacterial vaginosis       Relevant Medications   metroNIDAZOLE (FLAGYL) 500 MG tablet   Other Relevant Orders   Lipid Profile   COMPLETE METABOLIC PANEL WITH GFR   HIV antibody (with reflex)   Hepatitis C Antibody   RPR   Vitamin D (25 hydroxy)   POCT Wet Prep with KOH (Completed)   Screening for HIV (human immunodeficiency virus)       Relevant Orders   HIV antibody (with reflex)   Encounter for hepatitis C screening test for low risk patient       Relevant Orders   Hepatitis C Antibody   Screening examination for STD (sexually transmitted disease)       Relevant Orders   RPR   Chlamydia/Gonococcus/Trichomonas, NAA   Screening for colon cancer       Relevant Orders   Ambulatory referral to Gastroenterology     Recommend regular activity and continued weight loss efforts.  Labs pending. Discussed safe sex issues. Mammogram due later this summer.  Refer for screening colonoscopy. Saxenda reordered to see if insurance will cover. Meds ordered this encounter  Medications  . Liraglutide -Weight Management (SAXENDA) 18 MG/3ML SOPN    Sig: Inject 0.6 mg into the skin daily. X 1 week then increase to 1.2 mg daily x 1 week; if needed increase to 1.8 mg daily    Dispense:  3 pen    Refill:  0    Please send PA if needed    Order Specific Question:   Supervising Provider    Answer:   Lilyan PuntLUKING, SCOTT A [[id number]]  . metroNIDAZOLE (FLAGYL) 500 MG tablet    Sig: Take 1 tablet (500 mg total) by mouth 2 (two) times daily with a meal.    Dispense:  14 tablet    Refill:  0    Order Specific Question:   Supervising Provider    Answer:   Lilyan PuntLUKING, SCOTT A [[id number]]   Call back if  discharge continues.  Return in about 1 year (around 05/26/2020) for physical. Sooner if needed.

## 2019-05-28 ENCOUNTER — Other Ambulatory Visit: Payer: Self-pay | Admitting: Family Medicine

## 2019-05-28 ENCOUNTER — Encounter: Payer: Self-pay | Admitting: Family Medicine

## 2019-05-28 ENCOUNTER — Other Ambulatory Visit: Payer: Self-pay | Admitting: Nurse Practitioner

## 2019-05-28 LAB — COMPREHENSIVE METABOLIC PANEL
ALT: 20 IU/L (ref 0–32)
AST: 17 IU/L (ref 0–40)
Albumin/Globulin Ratio: 1.4 (ref 1.2–2.2)
Albumin: 4.1 g/dL (ref 3.8–4.8)
Alkaline Phosphatase: 81 IU/L (ref 39–117)
BUN/Creatinine Ratio: 11 (ref 9–23)
BUN: 9 mg/dL (ref 6–24)
Bilirubin Total: 0.6 mg/dL (ref 0.0–1.2)
CO2: 23 mmol/L (ref 20–29)
Calcium: 9.3 mg/dL (ref 8.7–10.2)
Chloride: 104 mmol/L (ref 96–106)
Creatinine, Ser: 0.84 mg/dL (ref 0.57–1.00)
GFR calc Af Amer: 98 mL/min/{1.73_m2} (ref >59)
GFR calc non Af Amer: 85 mL/min/{1.73_m2} (ref >59)
Globulin, Total: 2.9 g/dL (ref 1.5–4.5)
Glucose: 93 mg/dL (ref 65–99)
Potassium: 4.2 mmol/L (ref 3.5–5.2)
Sodium: 140 mmol/L (ref 134–144)
Total Protein: 7 g/dL (ref 6.0–8.5)

## 2019-05-28 LAB — LIPID PANEL
Chol/HDL Ratio: 3.8 ratio (ref 0.0–4.4)
Cholesterol, Total: 151 mg/dL (ref 100–199)
HDL: 40 mg/dL (ref >39)
LDL Calculated: 83 mg/dL (ref 0–99)
Triglycerides: 140 mg/dL (ref 0–149)
VLDL Cholesterol Cal: 28 mg/dL (ref 5–40)

## 2019-05-28 LAB — HIV ANTIBODY (ROUTINE TESTING W REFLEX): HIV Screen 4th Generation wRfx: NONREACTIVE

## 2019-05-28 LAB — VITAMIN D 25 HYDROXY (VIT D DEFICIENCY, FRACTURES): Vit D, 25-Hydroxy: 21.7 ng/mL — ABNORMAL LOW (ref 30.0–100.0)

## 2019-05-28 LAB — SPECIMEN STATUS REPORT

## 2019-05-28 LAB — HEPATITIS C ANTIBODY: Hep C Virus Ab: <0.1 s/co ratio (ref 0.0–0.9)

## 2019-05-28 LAB — RPR: RPR Ser Ql: NONREACTIVE

## 2019-05-28 MED ORDER — DICLOFENAC SODIUM 75 MG PO TBEC: 75.0000 mg | DELAYED_RELEASE_TABLET | Freq: Two times a day (BID) | ORAL | 1 refills | Status: DC

## 2019-05-28 MED ORDER — OMEPRAZOLE 40 MG PO CPDR: 40.0000 mg | DELAYED_RELEASE_CAPSULE | Freq: Every day | ORAL | 1 refills | Status: DC

## 2019-05-28 MED ORDER — PEN NEEDLES 32G X 4 MM MISC: 1.8000 mg | Freq: Every day | 0 refills | Status: DC

## 2019-05-28 NOTE — Telephone Encounter (Signed)
Six ref both

## 2019-05-29 ENCOUNTER — Encounter: Payer: Self-pay | Admitting: Family Medicine

## 2019-06-03 LAB — CHLAMYDIA/GONOCOCCUS/TRICHOMONAS, NAA
Chlamydia by NAA: NEGATIVE
Gonococcus by NAA: NEGATIVE
Trich vag by NAA: NEGATIVE

## 2019-06-03 LAB — SPECIMEN STATUS REPORT

## 2019-06-11 ENCOUNTER — Encounter: Payer: Self-pay | Admitting: Gastroenterology

## 2019-06-11 ENCOUNTER — Ambulatory Visit (INDEPENDENT_AMBULATORY_CARE_PROVIDER_SITE_OTHER): Payer: Managed Care, Other (non HMO) | Admitting: Gastroenterology

## 2019-06-11 VITALS — Ht 67.0 in | Wt 250.0 lb

## 2019-06-11 DIAGNOSIS — Z86010 Personal history of colonic polyps: Secondary | ICD-10-CM

## 2019-06-11 DIAGNOSIS — Z1211 Encounter for screening for malignant neoplasm of colon: Secondary | ICD-10-CM

## 2019-06-11 DIAGNOSIS — Z8371 Family history of colonic polyps: Secondary | ICD-10-CM

## 2019-06-11 MED ORDER — NA SULFATE-K SULFATE-MG SULF 17.5-3.13-1.6 GM/177ML PO SOLN: 1.0000 | ORAL | 0 refills | Status: DC
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
## 2019-06-18 ENCOUNTER — Other Ambulatory Visit: Payer: Self-pay | Admitting: Nurse Practitioner

## 2019-07-09 ENCOUNTER — Telehealth: Payer: Self-pay | Admitting: Gastroenterology

## 2019-07-09 NOTE — Telephone Encounter (Signed)
Pt responded "no" to all screening questions °

## 2019-07-09 NOTE — Telephone Encounter (Signed)

## 2019-07-10 ENCOUNTER — Ambulatory Visit (AMBULATORY_SURGERY_CENTER): Payer: PRIVATE HEALTH INSURANCE | Admitting: Gastroenterology

## 2019-07-10 ENCOUNTER — Encounter: Payer: Self-pay | Admitting: Gastroenterology

## 2019-07-10 ENCOUNTER — Other Ambulatory Visit: Payer: Self-pay

## 2019-07-10 VITALS — BP 103/68 | HR 86 | Temp 98.8°F | Resp 17 | Ht 67.0 in | Wt 250.0 lb

## 2019-07-10 DIAGNOSIS — Z800 Family history of malignant neoplasm of digestive organs: Secondary | ICD-10-CM

## 2019-07-10 DIAGNOSIS — Z1211 Encounter for screening for malignant neoplasm of colon: Secondary | ICD-10-CM | POA: Diagnosis not present

## 2019-07-10 DIAGNOSIS — Z8371 Family history of colonic polyps: Secondary | ICD-10-CM

## 2019-07-10 MED ORDER — SODIUM CHLORIDE 0.9 % IV SOLN: 500.0000 mL | Freq: Once | INTRAVENOUS | Status: DC

## 2019-07-10 NOTE — Progress Notes (Signed)
To PACU, VSS. Report to [email or web address] 

## 2019-07-10 NOTE — Progress Notes (Signed)
Pt's states no medical or surgical changes since previsit or office visit. 

## 2019-07-10 NOTE — Patient Instructions (Signed)
YOU HAD AN ENDOSCOPIC PROCEDURE TODAY AT THE [facility]:   Refer to the procedure report that was given to you for any specific questions about what was found during the examination.  If the procedure report does not answer your questions, please call your gastroenterologist to clarify.  If you requested that your care partner not be given the details of your procedure findings, then the procedure report has been included in a sealed envelope for you to review at your convenience later.  YOU SHOULD EXPECT: Some feelings of bloating in the abdomen. Passage of more gas than usual.  Walking can help get rid of the air that was put into your GI tract during the procedure and reduce the bloating. If you had a lower endoscopy (such as a colonoscopy or flexible sigmoidoscopy) you may notice spotting of blood in your stool or on the toilet paper. If you underwent a bowel prep for your procedure, you may not have a normal bowel movement for a few days.  Please Note:  You might notice some irritation and congestion in your nose or some drainage.  This is from the oxygen used during your procedure.  There is no need for concern and it should clear up in a day or so.  SYMPTOMS TO REPORT IMMEDIATELY:   Following lower endoscopy (colonoscopy or flexible sigmoidoscopy):  Excessive amounts of blood in the stool  Significant tenderness or worsening of abdominal pains  Swelling of the abdomen that is new, acute  Fever of 100F or higher  Please see handouts given to you on Diverticulosis.  For urgent or emergent issues, a gastroenterologist can be reached at any hour by calling [phone].   DIET:  We do recommend a small meal at first, but then you may proceed to your regular diet.  Drink plenty of fluids but you should avoid alcoholic beverages for 24 hours.  ACTIVITY:  You should plan to take it easy for the rest of today and you should NOT DRIVE or use heavy machinery until tomorrow  (because of the sedation medicines used during the test).    FOLLOW UP: Our staff will call the number listed on your records 48-72 hours following your procedure to check on you and address any questions or concerns that you may have regarding the information given to you following your procedure. If we do not reach you, we will leave a message.  We will attempt to reach you two times.  During this call, we will ask if you have developed any symptoms of COVID 19. If you develop any symptoms (ie: fever, flu-like symptoms, shortness of breath, cough etc.) before then, please call [phone].  If you test positive for Covid 19 in the 2 weeks post procedure, please call and report this information to us.    If any biopsies were taken you will be contacted by phone or by letter within the next 1-3 weeks.  Please call us at [phone] if you have not heard about the biopsies in 3 weeks.    SIGNATURES/CONFIDENTIALITY: You and/or your care partner have signed paperwork which will be entered into your electronic medical record.  These signatures attest to the fact that that the information above on your After Visit Summary has been reviewed and is understood.  Full responsibility of the confidentiality of this discharge information lies with you and/or your care-partner.  Thank you for letting us take care of your healthcare needs today. 

## 2019-07-10 NOTE — Op Note (Signed)
Stacie Roberts Patient Name: Stacie BettersKristie Roberts Procedure Date: 07/10/2019 2:28 PM MRN: [id number] Endoscopist: Tressia DanasKimberly Diantha Paxson MD, MD Age: 43 Referring MD:  Date of Birth: 05-11-1976 Gender: Female Account #: [id number] Procedure:                Colonoscopy Indications:              Family history of colon cancer (maternal aunt in                            her 5250s, mother died at age 44 of lung cancer)                           Personal history of colon polyps on colonoscopy                            performed in [place] (no procedure or path                            results available to me) Medicines:                See the Anesthesia note for documentation of the                            administered medications Procedure:                Pre-Anesthesia Assessment:                           - Prior to the procedure, a History and Physical                            was performed, and patient medications and                            allergies were reviewed. The patient's tolerance of                            previous anesthesia was also reviewed. The risks                            and benefits of the procedure and the sedation                            options and risks were discussed with the patient.                            All questions were answered, and informed consent                            was obtained. Prior Anticoagulants: The patient has                            taken no previous anticoagulant or antiplatelet  agents. ASA Grade Assessment: II - A patient with                            mild systemic disease. After reviewing the risks                            and benefits, the patient was deemed in                            satisfactory condition to undergo the procedure.                           After obtaining informed consent, the colonoscope                            was passed under direct vision. Throughout  the                            procedure, the patient's blood pressure, pulse, and                            oxygen saturations were monitored continuously. The                            Colonoscope was introduced through the anus and                            advanced to the the terminal ileum, with                            identification of the appendiceal orifice and IC                            valve. A second forward view of the right colon was                            performed. The colonoscopy was performed without                            difficulty. The patient tolerated the procedure                            well. The quality of the bowel preparation was                            good. The terminal ileum, ileocecal valve,                            appendiceal orifice, and rectum were photographed. Scope In: 2:39:04 PM Scope Out: 2:51:31 PM Scope Withdrawal Time: 0 hours 9 minutes 37 seconds  Total Procedure Duration: 0 hours 12 minutes 27 seconds  Findings:                 The perianal and digital rectal  examinations were                            normal.                           A few small-mouthed diverticula were found in the                            sigmoid colon, descending colon and ascending colon.                           The exam was otherwise without abnormality on                            direct and retroflexion views. Complications:            No immediate complications. Estimated Blood Loss:     Estimated blood loss: none. Impression:               - Diverticulosis in the sigmoid colon, in the                            descending colon and in the ascending colon.                           - The examination was otherwise normal on direct                            and retroflexion views.                           - No specimens collected. Recommendation:           - Patient has a contact number available for                             emergencies. The signs and symptoms of potential                            delayed complications were discussed with the                            patient. Return to normal activities tomorrow.                            Written discharge instructions were provided to the                            patient.                           - Resume regular diet. High fiber diet recommended.                           - Continue present medications.                           -  Repeat colonoscopy in 5 years for screening                            purposes given the family history of colon cancer. Thornton Park MD, MD 07/10/2019 2:56:48 PM This report has been signed electronically.

## 2019-07-12 ENCOUNTER — Telehealth: Payer: Self-pay | Admitting: *Deleted

## 2019-07-12 NOTE — Telephone Encounter (Signed)
  Follow up Call-  Call back number 07/10/2019  Post procedure Call Back phone  # [phone]  Permission to leave phone message Yes  Some recent data might be hidden     Patient questions:  Do you have a fever, pain , or abdominal swelling? No. Pain Score  0 *  Have you tolerated food without any problems? Yes.    Have you been able to return to your normal activities? Yes.    Do you have any questions about your discharge instructions: Diet   No. Medications  No. Follow up visit  No.  Do you have questions or concerns about your Care? No.  Actions: * If pain score is 4 or above: No action needed, pain <4.  1. Have you developed a fever since your procedure? NO  2.   Have you had an respiratory symptoms (SOB or cough) since your procedure? NO  3.   Have you tested positive for COVID 19 since your procedure NO  4.   Have you had any family members/close contacts diagnosed with the COVID 19 since your procedure? NO   If yes to any of these questions please route to Joylene John, RN and Alphonsa Gin, RN.

## 2019-07-22 ENCOUNTER — Other Ambulatory Visit: Payer: Self-pay | Admitting: Nurse Practitioner

## 2019-07-23 ENCOUNTER — Other Ambulatory Visit: Payer: Self-pay | Admitting: Nurse Practitioner

## 2019-07-23 DIAGNOSIS — Z1231 Encounter for screening mammogram for malignant neoplasm of breast: Secondary | ICD-10-CM

## 2019-08-02 ENCOUNTER — Ambulatory Visit
Admission: RE | Admit: 2019-08-02 | Discharge: 2019-08-02 | Disposition: A | Discharge status: Home / Self Care | Payer: PRIVATE HEALTH INSURANCE | Source: Ambulatory Visit

## 2019-08-02 ENCOUNTER — Other Ambulatory Visit: Payer: Self-pay

## 2019-08-02 DIAGNOSIS — Z1231 Encounter for screening mammogram for malignant neoplasm of breast: Secondary | ICD-10-CM

## 2019-08-15 ENCOUNTER — Other Ambulatory Visit: Payer: Self-pay | Admitting: Nurse Practitioner

## 2019-08-20 ENCOUNTER — Other Ambulatory Visit: Payer: Self-pay

## 2019-08-20 ENCOUNTER — Encounter: Payer: Self-pay | Admitting: Family Medicine

## 2019-08-20 MED ORDER — OMEPRAZOLE 40 MG PO CPDR: 40.0000 mg | DELAYED_RELEASE_CAPSULE | Freq: Every day | ORAL | 1 refills | Status: DC

## 2019-10-01 ENCOUNTER — Other Ambulatory Visit: Payer: Self-pay | Admitting: Nurse Practitioner

## 2020-03-02 ENCOUNTER — Other Ambulatory Visit: Payer: Self-pay | Admitting: Family Medicine

## 2020-03-23 ENCOUNTER — Telehealth: Payer: Self-pay | Admitting: Family Medicine

## 2020-03-23 DIAGNOSIS — Z13220 Encounter for screening for lipoid disorders: Secondary | ICD-10-CM

## 2020-03-23 DIAGNOSIS — Z0000 Encounter for general adult medical examination without abnormal findings: Secondary | ICD-10-CM

## 2020-03-23 DIAGNOSIS — E559 Vitamin D deficiency, unspecified: Secondary | ICD-10-CM

## 2020-03-23 NOTE — Telephone Encounter (Signed)
Lipid, liver, metabolic 7, vitamin D 

## 2020-03-23 NOTE — Telephone Encounter (Signed)
Patient has physical 6/11 and requesting labs

## 2020-03-23 NOTE — Telephone Encounter (Signed)
Last labs 05/27/19 vit d, cmp, rpr, hep c antibody, hiv, lipid

## 2020-03-24 NOTE — Telephone Encounter (Signed)
Orders put in and pt was notified.  

## 2020-05-19 ENCOUNTER — Other Ambulatory Visit: Payer: Self-pay

## 2020-05-19 ENCOUNTER — Ambulatory Visit: Payer: PRIVATE HEALTH INSURANCE

## 2020-05-19 ENCOUNTER — Encounter: Payer: Self-pay | Admitting: Orthopedic Surgery

## 2020-05-19 ENCOUNTER — Ambulatory Visit: Payer: PRIVATE HEALTH INSURANCE | Admitting: Orthopedic Surgery

## 2020-05-19 VITALS — BP 145/85 | HR 95 | Ht 69.0 in | Wt 246.0 lb

## 2020-05-19 DIAGNOSIS — M2241 Chondromalacia patellae, right knee: Secondary | ICD-10-CM

## 2020-05-19 DIAGNOSIS — G8929 Other chronic pain: Secondary | ICD-10-CM

## 2020-05-19 DIAGNOSIS — M25562 Pain in left knee: Secondary | ICD-10-CM | POA: Diagnosis not present

## 2020-05-19 DIAGNOSIS — M2242 Chondromalacia patellae, left knee: Secondary | ICD-10-CM

## 2020-05-19 MED ORDER — MELOXICAM 7.5 MG PO TABS: 7.5000 mg | ORAL_TABLET | Freq: Every day | ORAL | 5 refills | Status: DC

## 2020-05-19 NOTE — Progress Notes (Signed)
Chief Complaint  Patient presents with  . Knee Pain    left worse than right     44 year old female previously seen for chondromalacia of the patella had an MRI back in 2010 showed mild chondromalacia with normal tibiofemoral joint surfaces  Presents with 2 to 75-month history of increasing pain medial joint line with locking and catching swelling and decreased range of motion  She is already on diclofenac does not seem to have helped.  System review no fever or chills no chest pain or shortness of breath no numbness or tingling  Past Medical History:  Diagnosis Date  . Arthritis    Past Surgical History:  Procedure Laterality Date  . CHOLECYSTECTOMY  03/2010  . colonoscopyi    . DIAGNOSTIC LAPAROSCOPY  03/2010   Family History  Problem Relation Age of Onset  . Cancer Mother   . Hypertension Mother   . Diabetes Mother   . Stroke Mother   . Asthma Mother   . COPD Mother   . Hypertension Father   . Cancer Maternal Aunt        colon  . Colon cancer Maternal Aunt   . Cancer Maternal Grandmother        pancreatic   Social History   Tobacco Use  . Smoking status: Never Smoker  . Smokeless tobacco: Never Used  Substance Use Topics  . Alcohol use: No  . Drug use: No    BP (!) 145/85   Pulse 95   Ht 5\' 9"  (1.753 m)   Wt 246 lb (111.6 kg)   BMI 36.33 kg/m   Physical Exam Constitutional:      General: She is not in acute distress.    Appearance: She is well-developed.  Cardiovascular:     Comments: No peripheral edema Skin:    General: Skin is warm and dry.  Neurological:     Mental Status: She is alert and oriented to person, place, and time.     Sensory: No sensory deficit.     Coordination: Coordination normal.     Gait: Gait normal.     Deep Tendon Reflexes: Reflexes are normal and symmetric.    Left knee no effusion but she has a lot of tenderness on the medial joint line I do not palpate any lateral tenderness.  She does have hyperextension from  chronic ligament laxity seems to have full flexion of the joint.  However McMurray's sign is positive.  The screw home test is positive. Crepitance patellofemoral area with no pain with compression and quadriceps contraction test   Right knee is normal  Neurovascular intact bilaterally  X-rays show normal tibiofemoral joints in both knees normal patellofemoral joint  Recommend MRI left knee  Encounter Diagnoses  Name Primary?  . Chondromalacia of left patella   . Chondromalacia patellae, right knee   . Chronic pain of left knee Yes    And change diclofenac to meloxicam  Meds ordered this encounter  Medications  . meloxicam (MOBIC) 7.5 MG tablet    Sig: Take 1 tablet (7.5 mg total) by mouth daily.    Dispense:  30 tablet    Refill:  5

## 2020-05-20 LAB — LIPID PANEL
Chol/HDL Ratio: 4.2 ratio (ref 0.0–4.4)
Cholesterol, Total: 160 mg/dL (ref 100–199)
HDL: 38 mg/dL — ABNORMAL LOW (ref >39)
LDL Chol Calc (NIH): 92 mg/dL (ref 0–99)
Triglycerides: 175 mg/dL — ABNORMAL HIGH (ref 0–149)
VLDL Cholesterol Cal: 30 mg/dL (ref 5–40)

## 2020-05-20 LAB — HEPATIC FUNCTION PANEL
ALT: 20 IU/L (ref 0–32)
AST: 18 IU/L (ref 0–40)
Albumin: 3.7 g/dL — ABNORMAL LOW (ref 3.8–4.8)
Alkaline Phosphatase: 93 IU/L (ref 48–121)
Bilirubin Total: 0.5 mg/dL (ref 0.0–1.2)
Bilirubin, Direct: 0.15 mg/dL (ref 0.00–0.40)
Total Protein: 6.7 g/dL (ref 6.0–8.5)

## 2020-05-20 LAB — BASIC METABOLIC PANEL
BUN/Creatinine Ratio: 10 (ref 9–23)
BUN: 9 mg/dL (ref 6–24)
CO2: 24 mmol/L (ref 20–29)
Calcium: 8.9 mg/dL (ref 8.7–10.2)
Chloride: 104 mmol/L (ref 96–106)
Creatinine, Ser: 0.94 mg/dL (ref 0.57–1.00)
GFR calc Af Amer: 85 mL/min/{1.73_m2} (ref >59)
GFR calc non Af Amer: 74 mL/min/{1.73_m2} (ref >59)
Glucose: 89 mg/dL (ref 65–99)
Potassium: 4.3 mmol/L (ref 3.5–5.2)
Sodium: 141 mmol/L (ref 134–144)

## 2020-05-20 LAB — VITAMIN D 25 HYDROXY (VIT D DEFICIENCY, FRACTURES): Vit D, 25-Hydroxy: 19.3 ng/mL — ABNORMAL LOW (ref 30.0–100.0)

## 2020-05-29 ENCOUNTER — Encounter: Payer: Self-pay | Admitting: Nurse Practitioner

## 2020-05-29 ENCOUNTER — Ambulatory Visit (INDEPENDENT_AMBULATORY_CARE_PROVIDER_SITE_OTHER): Payer: PRIVATE HEALTH INSURANCE | Admitting: Nurse Practitioner

## 2020-05-29 ENCOUNTER — Other Ambulatory Visit: Payer: Self-pay

## 2020-05-29 VITALS — BP 134/74 | Temp 97.4°F | Ht 69.0 in | Wt 254.8 lb

## 2020-05-29 DIAGNOSIS — R8290 Unspecified abnormal findings in urine: Secondary | ICD-10-CM

## 2020-05-29 DIAGNOSIS — E559 Vitamin D deficiency, unspecified: Secondary | ICD-10-CM | POA: Diagnosis not present

## 2020-05-29 DIAGNOSIS — Z01419 Encounter for gynecological examination (general) (routine) without abnormal findings: Secondary | ICD-10-CM

## 2020-05-29 LAB — POCT UA - MICROSCOPIC ONLY

## 2020-05-29 LAB — POCT URINALYSIS DIPSTICK
Nitrite, UA: POSITIVE
Spec Grav, UA: >=1.03 — AB (ref 1.010–1.025)
Urobilinogen, UA: 4 E.U./dL — AB
pH, UA: 6 (ref 5.0–8.0)

## 2020-05-29 MED ORDER — NITROFURANTOIN MONOHYD MACRO 100 MG PO CAPS
100.0000 mg | ORAL_CAPSULE | Freq: Two times a day (BID) | ORAL | 0 refills | Status: DC
Start: 2020-05-29 — End: 2021-03-15

## 2020-05-29 MED ORDER — OMEPRAZOLE 40 MG PO CPDR: DELAYED_RELEASE_CAPSULE | ORAL | 1 refills | Status: DC

## 2020-05-29 MED ORDER — SAXENDA 18 MG/3ML ~~LOC~~ SOPN: PEN_INJECTOR | SUBCUTANEOUS | 5 refills | Status: DC

## 2020-05-29 MED ORDER — VITAMIN D (ERGOCALCIFEROL) 1.25 MG (50000 UNIT) PO CAPS: 50000.0000 [IU] | ORAL_CAPSULE | ORAL | 2 refills | Status: DC

## 2020-05-29 NOTE — Progress Notes (Signed)
   Subjective:    Patient ID: Stacie Roberts, female    DOB: 09/10/1976, 44 y.o.   MRN: [id number]  HPI The patient comes in today for a wellness visit.    A review of their health history was completed.  A review of medications was also completed.  Any needed refills; Omeprazole [facility]) Saxenda [facility] Scales) and needles to use with pen   Eating habits: healthy  Falls/  MVA accidents in past few months: none  Regular exercise: walks  Specialist pt sees on regular basis: Dr.Harrison-ortho  Preventative health issues were discussed.   Additional concerns: vit d level is low; per result note Dr.Scott recommends speaking with provider at check up     Review of Systems     Objective:   Physical Exam        Assessment & Plan:

## 2020-05-29 NOTE — Progress Notes (Signed)
Subjective:    Patient ID: Stacie Roberts, female    DOB: [birth year], 44 y.o.   MRN: [id number]  HPI Presents for wellness visit. Noted complaints of odor with urination with onset of 5 days. Denies fever, frequency or burning. Stated she has had decrease in water intake and has been taking OTC Azo tabs with ineffective results. Covid vaccination completed on 04/11/20. Up to date with flu vaccinations. Routine vision and dental exams completed. Due to history of family history of colon cancer, colonoscopy completed routinely.Does not adhere to diet plan and exercises 3-4 times a week. States she works two jobs and is on her feet a lot with [employer]. Denies tobacco use and consumes alcohol socially. Recently restarted Saxenda for weight loss and requests to be reordered. Sexually active with same partner. Mirena utilized for birth control. Not due for replacement until next year.  Denies vaginal bleeding, cramping or discharge. Also request for Omeprazole to be reordered.   Review of Systems  Constitutional: Negative for activity change, appetite change, fatigue, fever and unexpected weight change.  Respiratory: Negative for cough, chest tightness, shortness of breath and wheezing.   Cardiovascular: Negative for chest pain, palpitations and leg swelling.  Gastrointestinal: Negative for abdominal distention, abdominal pain, constipation, diarrhea, nausea and vomiting.  Genitourinary: Positive for difficulty urinating. Negative for dysuria, enuresis, flank pain, frequency, genital sores, hematuria, menstrual problem, pelvic pain, urgency, vaginal bleeding and vaginal discharge.   Depression screen [facility] 2/9 05/29/2020 06/25/2018 05/29/2017  Decreased Interest 0 0 0  Down, Depressed, Hopeless 0 0 0  PHQ - 2 Score 0 0 0  Altered sleeping 1 - -  Tired, decreased energy 0 - -  Change in appetite 0 - -  Feeling bad or failure about yourself  0 - -  Trouble concentrating 0 - -  Moving slowly or  fidgety/restless 0 - -  Suicidal thoughts 0 - -  PHQ-9 Score 1 - -  Difficult doing work/chores Not difficult at all - -        Objective:   Physical Exam Vitals reviewed. Exam conducted with a chaperone present.  Constitutional:      Appearance: Normal appearance.  Neck:     Thyroid: No thyroid mass, thyromegaly or thyroid tenderness.  Cardiovascular:     Rate and Rhythm: Normal rate and regular rhythm.     Heart sounds: S1 normal and S2 normal. No murmur heard.   Pulmonary:     Effort: Pulmonary effort is normal.     Breath sounds: Normal breath sounds. No stridor, decreased air movement or transmitted upper airway sounds.  Chest:     Breasts:        Right: Inverted nipple present. No mass or nipple discharge.        Left: No inverted nipple, mass or nipple discharge.     Comments: Chronic inverted nipple without complaints of pain or discharge Abdominal:     General: Abdomen is flat. There is no distension.     Palpations: There is no hepatomegaly, splenomegaly or mass.     Tenderness: There is no abdominal tenderness.  Genitourinary:    Labia:        Right: No rash, tenderness or lesion.        Left: No rash, tenderness or lesion.      Vagina: No vaginal discharge, erythema, tenderness or lesions.     Cervix: No cervical motion tenderness or erythema.     Comments: Extenal GU: No rashes or  lesions. Bimanual exam without tenderness or obvious mass. No vaginal discharge. Exam limited related to abdominal girth Lymphadenopathy:     Cervical: No cervical adenopathy.     Right cervical: No superficial, deep or posterior cervical adenopathy.    Left cervical: No superficial, deep or posterior cervical adenopathy.     Upper Body:     Right upper body: No supraclavicular, axillary or pectoral adenopathy.     Left upper body: No supraclavicular, axillary or pectoral adenopathy.  Neurological:     Mental Status: She is alert.  Psychiatric:        Mood and Affect: Mood  normal.        Behavior: Behavior normal.        Thought Content: Thought content normal.        Judgment: Judgment normal.    Results for orders placed or performed in visit on 05/29/20  POCT urinalysis dipstick  Result Value Ref Range   Color, UA     Clarity, UA     Glucose, UA     Bilirubin, UA     Ketones, UA     Spec Grav, UA >=1.030 (A) 1.010 - 1.025   Blood, UA     pH, UA 6.0 5.0 - 8.0   Protein, UA     Urobilinogen, UA 4.0 (A) 0.2 or 1.0 E.U./dL   Nitrite, UA positive    Leukocytes, UA     Appearance     Odor    POCT UA - Microscopic Only  Result Value Ref Range   WBC, Ur, HPF, POC 0-2    RBC, urine, microscopic rare    Bacteria, U Microscopic moderate    Mucus, UA     Epithelial cells, urine per micros     Crystals, Ur, HPF, POC     Casts, Ur, LPF, POC     Yeast, UA          Assessment & Plan:   Problem List Items Addressed This Visit      Other   Vitamin D deficiency    Other Visit Diagnoses    Well woman exam    -  Primary   Abnormal urine odor/probable UTI      Relevant Orders   POCT urinalysis dipstick (Completed)   POCT UA - Microscopic Only (Completed)   Urine Culture     Meds ordered this encounter  Medications  . Liraglutide -Weight Management (SAXENDA) 18 MG/3ML SOPN    Sig: Inject 1.2mg  sq daily    Dispense:  3 mL    Refill:  5    Order Specific Question:   Supervising Provider    Answer:   Sallee Lange A [[id number]]  . Vitamin D, Ergocalciferol, (DRISDOL) 1.25 MG (50000 UNIT) CAPS capsule    Sig: Take 1 capsule (50,000 Units total) by mouth every 7 (seven) days.    Dispense:  4 capsule    Refill:  2    Order Specific Question:   Supervising Provider    Answer:   Sallee Lange A [[id number]]  . omeprazole (PRILOSEC) 40 MG capsule    Sig: TAKE 1 CAPSULE BY MOUTH EVERY DAY    Dispense:  90 capsule    Refill:  1    Order Specific Question:   Supervising Provider    Answer:   Sallee Lange A [[id number]]  . nitrofurantoin,  macrocrystal-monohydrate, (MACROBID) 100 MG capsule    Sig: Take 1 capsule (100 mg total) by mouth 2 (two) times daily.  Dispense:  14 capsule    Refill:  0    Order Specific Question:   Supervising Provider    Answer:   Babs Sciara [phone]  Educate on importance of diet and exercise Education provided on signs and symptoms of UTI including fever, flank pain and dysuria. Urine culture pending. Call back in 72 hours if no improvement, sooner if worse.  Follow up in three months unless earlier appointment required.

## 2020-05-31 LAB — URINE CULTURE

## 2020-06-04 ENCOUNTER — Ambulatory Visit: Payer: PRIVATE HEALTH INSURANCE | Admitting: Orthopedic Surgery

## 2020-06-05 ENCOUNTER — Encounter: Payer: Self-pay | Admitting: Orthopedic Surgery

## 2020-06-16 ENCOUNTER — Telehealth: Payer: Self-pay | Admitting: Radiology

## 2020-06-16 ENCOUNTER — Ambulatory Visit: Payer: PRIVATE HEALTH INSURANCE | Admitting: Orthopedic Surgery

## 2020-06-16 NOTE — Telephone Encounter (Signed)
MR KNEE LEFT WO CONTRAST  Impression Performed by [facility]   1. No meniscal tear. Cruciate and collateral ligaments are intact.  2. High-grade patellofemoral compartment chondrosis.  3. Small knee joint effusion. Narrative Performed by [facility] MRI LEFT KNEE WITHOUT CONTRAST, 06/05/2020 4:05 PM   INDICATION: \ M22.42 Chondromalacia of left patella  COMPARISON: None.   TECHNIQUE: Multi-planar, multi-sequence MR imaging of the left knee was performed without contrast.   STUDY LIMITATIONS: None.   FINDINGS:   . Bone: No acute fracture.  . Cruciate ligaments: Intact.  . Collateral ligaments: Intact.  . Medial meniscus: Intact.  . Lateral meniscus: Intact.  . Medial compartment: No deep chondral defect.  . Lateral compartment: No deep chondral defect.  . Patellofemoral compartment: High-grade chondrosis of the lateral patellar facet and median ridge. Lateral patellar translation.  Marland Kitchen Extensor mechanism: Intact.  . Joint: Small knee joint effusion.  . Soft tissues: No Baker cyst. Procedure Note  Interface, Rad Results In - 06/06/2020 2:09 PM EDT  Formatting of this note might be different from the original.  MRI LEFT KNEE WITHOUT CONTRAST, 06/05/2020 4:05 PM   INDICATION: \ M22.42 Chondromalacia of left patella  COMPARISON: None.   TECHNIQUE: Multi-planar, multi-sequence MR imaging of the left knee was performed without contrast.   STUDY LIMITATIONS: None.   FINDINGS:   . Bone: No acute fracture.  . Cruciate ligaments: Intact.  . Collateral ligaments: Intact.  . Medial meniscus: Intact.  . Lateral meniscus: Intact.  . Medial compartment: No deep chondral defect.  . Lateral compartment: No deep chondral defect.  . Patellofemoral compartment: High-grade chondrosis of the lateral patellar facet and median ridge. Lateral patellar translation.  Marland Kitchen Extensor mechanism: Intact.  . Joint: Small knee joint effusion.  . Soft tissues: No Baker cyst.    CONCLUSION:    1. No meniscal tear. Cruciate and collateral ligaments are intact.  2. High-grade patellofemoral compartment chondrosis.  3. Small knee joint effusion. Specimen Collected: 06/06/20 9:39 AM Last Resulted: 06/06/20 2:07 PM  Received From: [facility]  Result Received: 06/15/20 1:30 PM

## 2020-07-06 ENCOUNTER — Telehealth: Payer: Self-pay | Admitting: Orthopedic Surgery

## 2020-07-06 NOTE — Telephone Encounter (Addendum)
Patient called to relay she has not received her MRI results [facility]) - states appointment had to be cancelled with Dr Romeo Apple; said still awaiting results. Copy of report received. Please advise.

## 2020-07-06 NOTE — Telephone Encounter (Signed)
I have placed a copy of it in your box. Dr Romeo Apple was given a copy the day of the last scheduled appointment which was cancelled.

## 2020-07-06 NOTE — Telephone Encounter (Signed)
Can I have report?

## 2020-07-06 NOTE — Telephone Encounter (Signed)
MR KNEE LEFT WO CONTRAST  Impression Performed by [facility]   1. No meniscal tear. Cruciate and collateral ligaments are intact.  2. High-grade patellofemoral compartment chondrosis.  3. Small knee joint effusion. Narrative Performed by [facility] MRI LEFT KNEE WITHOUT CONTRAST, 06/05/2020 4:05 PM   INDICATION: \ M22.42 Chondromalacia of left patella  COMPARISON: None.   TECHNIQUE: Multi-planar, multi-sequence MR imaging of the left knee was performed without contrast.   STUDY LIMITATIONS: None.   FINDINGS:   . Bone: No acute fracture.  . Cruciate ligaments: Intact.  . Collateral ligaments: Intact.  . Medial meniscus: Intact.  . Lateral meniscus: Intact.  . Medial compartment: No deep chondral defect.  . Lateral compartment: No deep chondral defect.  . Patellofemoral compartment: High-grade chondrosis of the lateral patellar facet and median ridge. Lateral patellar translation.  Marland Kitchen Extensor mechanism: Intact.  . Joint: Small knee joint effusion.  . Soft tissues: No Baker cyst. Procedure Note  Interface, Rad Results In - 06/06/2020 2:09 PM EDT  Formatting of this note might be different from the original.  MRI LEFT KNEE WITHOUT CONTRAST, 06/05/2020 4:05 PM   INDICATION: \ M22.42 Chondromalacia of left patella  COMPARISON: None.   TECHNIQUE: Multi-planar, multi-sequence MR imaging of the left knee was performed without contrast.   STUDY LIMITATIONS: None.   FINDINGS:   . Bone: No acute fracture.  . Cruciate ligaments: Intact.  . Collateral ligaments: Intact.  . Medial meniscus: Intact.  . Lateral meniscus: Intact.  . Medial compartment: No deep chondral defect.  . Lateral compartment: No deep chondral defect.  . Patellofemoral compartment: High-grade chondrosis of the lateral patellar facet and median ridge. Lateral patellar translation.  Marland Kitchen Extensor mechanism: Intact.  . Joint: Small knee joint effusion.  . Soft tissues: No Baker cyst.    CONCLUSION:    1. No meniscal tear. Cruciate and collateral ligaments are intact.  2. High-grade patellofemoral compartment chondrosis.  3. Small knee joint effusion. Specimen Collected: 06/06/20 9:39 AM Last Resulted: 06/06/20 2:07 PM  Received From: [facility]  Result Received: 06/15/20 1:30 PM   Can you call her? You had [place] cancel last appointment and stated you would call with MRI report.

## 2020-07-27 ENCOUNTER — Telehealth: Payer: Self-pay | Admitting: Orthopedic Surgery

## 2020-07-27 DIAGNOSIS — M2241 Chondromalacia patellae, right knee: Secondary | ICD-10-CM

## 2020-07-27 DIAGNOSIS — M2242 Chondromalacia patellae, left knee: Secondary | ICD-10-CM

## 2020-07-27 DIAGNOSIS — M25562 Pain in left knee: Secondary | ICD-10-CM

## 2020-07-27 MED ORDER — CELECOXIB 200 MG PO CAPS: 200.0000 mg | ORAL_CAPSULE | Freq: Every day | ORAL | 0 refills | Status: DC

## 2020-07-27 NOTE — Telephone Encounter (Signed)
Meds ordered this encounter  Medications  . celecoxib (CELEBREX) 200 MG capsule    Sig: Take 1 capsule (200 mg total) by mouth daily.    Dispense:  30 capsule    Refill:  40   44 year old female had MRI at [facility]. I do not have the disc but I read the report she has chondrosis high-grade median ridge and lateral facet of the patella.  She is already had anti-inflammatories meloxicam diclofenac she has had injections of cortisone she had therapy she has not gotten good pain relief were switching over to Celebrex and I think she should try some of the alternative injections and she will call doctors at [facility] which is her place of work she will start the Celebrex to see if she gets any improvement that way

## 2020-10-06 ENCOUNTER — Telehealth: Payer: Self-pay

## 2020-10-06 NOTE — Telephone Encounter (Signed)
Patient is requesting referral for mammogram.

## 2020-10-08 ENCOUNTER — Other Ambulatory Visit: Payer: Self-pay | Admitting: Family Medicine

## 2020-10-08 DIAGNOSIS — Z1231 Encounter for screening mammogram for malignant neoplasm of breast: Secondary | ICD-10-CM

## 2021-01-27 ENCOUNTER — Other Ambulatory Visit: Payer: Self-pay | Admitting: Nurse Practitioner

## 2021-01-27 MED ORDER — DICLOFENAC SODIUM 75 MG PO TBEC
75.0000 mg | DELAYED_RELEASE_TABLET | Freq: Two times a day (BID) | ORAL | 2 refills | Status: DC
Start: 2021-01-27 — End: 2021-03-15

## 2021-03-15 ENCOUNTER — Other Ambulatory Visit: Payer: Self-pay

## 2021-03-15 ENCOUNTER — Encounter: Payer: Self-pay | Admitting: Family Medicine

## 2021-03-15 ENCOUNTER — Ambulatory Visit (INDEPENDENT_AMBULATORY_CARE_PROVIDER_SITE_OTHER): Payer: No Typology Code available for payment source | Admitting: Family Medicine

## 2021-03-15 VITALS — BP 109/75 | HR 77 | Temp 98.2°F | Wt 271.0 lb

## 2021-03-15 DIAGNOSIS — M5442 Lumbago with sciatica, left side: Secondary | ICD-10-CM

## 2021-03-15 DIAGNOSIS — M25562 Pain in left knee: Secondary | ICD-10-CM | POA: Diagnosis not present

## 2021-03-15 MED ORDER — MELOXICAM 15 MG PO TABS: 15.0000 mg | ORAL_TABLET | Freq: Every day | ORAL | 0 refills | Status: DC

## 2021-03-15 NOTE — Patient Instructions (Signed)
Alternate ice and heat for 20 minutes every 1-2 hours. Do back exercises as tolerated.      Back Exercises These exercises help to make your trunk and back strong. They also help to keep the lower back flexible. Doing these exercises can help to prevent back pain or lessen existing pain.  If you have back pain, try to do these exercises 2-3 times each day or as told by your doctor.  As you get better, do the exercises once each day. Repeat the exercises more often as told by your doctor.  To stop back pain from coming back, do the exercises once each day, or as told by your doctor. Exercises Single knee to chest Do these steps 3-5 times in a row for each leg: 1. Lie on your back on a firm bed or the floor with your legs stretched out. 2. Bring one knee to your chest. 3. Grab your knee or thigh with both hands and hold them it in place. 4. Pull on your knee until you feel a gentle stretch in your lower back or buttocks. 5. Keep doing the stretch for 10-30 seconds. 6. Slowly let go of your leg and straighten it. Pelvic tilt Do these steps 5-10 times in a row: 1. Lie on your back on a firm bed or the floor with your legs stretched out. 2. Bend your knees so they point up to the ceiling. Your feet should be flat on the floor. 3. Tighten your lower belly (abdomen) muscles to press your lower back against the floor. This will make your tailbone point up to the ceiling instead of pointing down to your feet or the floor. 4. Stay in this position for 5-10 seconds while you gently tighten your muscles and breathe evenly. Cat-cow Do these steps until your lower back bends more easily: 1. Get on your hands and knees on a firm surface. Keep your hands under your shoulders, and keep your knees under your hips. You may put padding under your knees. 2. Let your head hang down toward your chest. Tighten (contract) the muscles in your belly. Point your tailbone toward the floor so your lower back  becomes rounded like the back of a cat. 3. Stay in this position for 5 seconds. 4. Slowly lift your head. Let the muscles of your belly relax. Point your tailbone up toward the ceiling so your back forms a sagging arch like the back of a cow. 5. Stay in this position for 5 seconds.   Press-ups Do these steps 5-10 times in a row: 1. Lie on your belly (face-down) on the floor. 2. Place your hands near your head, about shoulder-width apart. 3. While you keep your back relaxed and keep your hips on the floor, slowly straighten your arms to raise the top half of your body and lift your shoulders. Do not use your back muscles. You may change where you place your hands in order to make yourself more comfortable. 4. Stay in this position for 5 seconds. 5. Slowly return to lying flat on the floor.   Bridges Do these steps 10 times in a row: 1. Lie on your back on a firm surface. 2. Bend your knees so they point up to the ceiling. Your feet should be flat on the floor. Your arms should be flat at your sides, next to your body. 3. Tighten your butt muscles and lift your butt off the floor until your waist is almost as high as your knees.  If you do not feel the muscles working in your butt and the back of your thighs, slide your feet 1-2 inches farther away from your butt. 4. Stay in this position for 3-5 seconds. 5. Slowly lower your butt to the floor, and let your butt muscles relax. If this exercise is too easy, try doing it with your arms crossed over your chest.   Belly crunches Do these steps 5-10 times in a row: 1. Lie on your back on a firm bed or the floor with your legs stretched out. 2. Bend your knees so they point up to the ceiling. Your feet should be flat on the floor. 3. Cross your arms over your chest. 4. Tip your chin a little bit toward your chest but do not bend your neck. 5. Tighten your belly muscles and slowly raise your chest just enough to lift your shoulder blades a tiny bit  off of the floor. Avoid raising your body higher than that, because it can put too much stress on your low back. 6. Slowly lower your chest and your head to the floor. Back lifts Do these steps 5-10 times in a row: 1. Lie on your belly (face-down) with your arms at your sides, and rest your forehead on the floor. 2. Tighten the muscles in your legs and your butt. 3. Slowly lift your chest off of the floor while you keep your hips on the floor. Keep the back of your head in line with the curve in your back. Look at the floor while you do this. 4. Stay in this position for 3-5 seconds. 5. Slowly lower your chest and your face to the floor. Contact a doctor if:  Your back pain gets a lot worse when you do an exercise.  Your back pain does not get better 2 hours after you exercise. If you have any of these problems, stop doing the exercises. Do not do them again unless your doctor says it is okay. Get help right away if:  You have sudden, very bad back pain. If this happens, stop doing the exercises. Do not do them again unless your doctor says it is okay. This information is not intended to replace advice given to you by your health care provider. Make sure you discuss any questions you have with your health care provider. Document Revised: 08/30/2018 Document Reviewed: 08/30/2018 Elsevier Patient Education  2021 [employer].

## 2021-03-15 NOTE — Progress Notes (Signed)
Pt having pain in left leg upper thigh area. Also low back pain. Pt states she does have to have both patella replaced. Pt takes Diclofenac, Tylenol, [facility]y [facility], Voltaren gel. Pt states that she was standing in the hallway at home and her legs gave out and she fell.     Patient ID: Stacie Roberts, female    DOB: [birth year], 45 y.o.   MRN: [id number]   Chief Complaint  Patient presents with  . Leg Pain   Subjective:  CC: left leg and low back pain  This is a new problem.  Presents today with left leg and low back pain.  Symptoms started 2 weeks ago.  Has a history of left knee swelling, sees Dr. Romeo Apple, needs her patellas placed bilaterally.  The swelling is not new, it felt as though her upper leg was swelling as well.  She does have a history of osteoarthritis.  Has tried diclofenac which is no longer helpful.  Has tried in the past Celebrex, ibuprofen.  She denies weakness, numbness, tingling.  Reports that it feels as though the pain from the low back does radiate down the left leg.  Denies fever, chills, chest pain, shortness of breath.    Medical History Stacie Roberts has a past medical history of Arthritis.   Outpatient Encounter Medications as of 03/15/2021  Medication Sig  . meloxicam (MOBIC) 15 MG tablet Take 1 tablet (15 mg total) by mouth daily.  Marland Kitchen omeprazole (PRILOSEC) 40 MG capsule TAKE 1 CAPSULE BY MOUTH EVERY DAY  . [DISCONTINUED] diclofenac (VOLTAREN) 75 MG EC tablet Take 1 tablet (75 mg total) by mouth 2 (two) times daily with a meal.  . [DISCONTINUED] BD PEN NEEDLE NANO U/F 32G X 4 MM MISC INJECT 1.8 MG INTO THE SKIN DAILY. USE WITH SAXENDA PEN AS DIRECTED.  . [DISCONTINUED] Liraglutide -Weight Management (SAXENDA) 18 MG/3ML SOPN Inject 1.2mg  sq daily  . [DISCONTINUED] nitrofurantoin, macrocrystal-monohydrate, (MACROBID) 100 MG capsule Take 1 capsule (100 mg total) by mouth 2 (two) times daily.  . [DISCONTINUED] Vitamin D, Ergocalciferol, (DRISDOL) 1.25 MG  (50000 UNIT) CAPS capsule Take 1 capsule (50,000 Units total) by mouth every 7 (seven) days.   No facility-administered encounter medications on file as of 03/15/2021.     Review of Systems  Constitutional: Negative for chills and fever.  Respiratory: Negative for chest tightness and shortness of breath.   Cardiovascular: Negative for chest pain.  Gastrointestinal: Negative for abdominal pain.  Musculoskeletal: Positive for back pain and joint swelling.       History of fluid around knee cap, felt swelling in upper thigh too.   Neurological: Negative for headaches.     Vitals BP 109/75   Pulse 77   Temp 98.2 F (36.8 C)   Wt 271 lb (122.9 kg)   SpO2 96%   BMI 40.02 kg/m   Objective:   Physical Exam Vitals reviewed.  Constitutional:      Appearance: Normal appearance.  Cardiovascular:     Rate and Rhythm: Normal rate and regular rhythm.     Heart sounds: Normal heart sounds.  Pulmonary:     Effort: Pulmonary effort is normal.     Breath sounds: Normal breath sounds.  Skin:    General: Skin is warm and dry.  Neurological:     General: No focal deficit present.     Mental Status: She is alert.     Motor: No weakness.     Gait: Gait is intact.  Comments: 5/5 upper and lower extremity strength. Negative straight leg raise.   Psychiatric:        Behavior: Behavior normal.      Assessment and Plan   1. Acute midline low back pain with left-sided sciatica - meloxicam (MOBIC) 15 MG tablet; Take 1 tablet (15 mg total) by mouth daily.  Dispense: 30 tablet; Refill: 0  2. Acute pain of left knee - meloxicam (MOBIC) 15 MG tablet; Take 1 tablet (15 mg total) by mouth daily.  Dispense: 30 tablet; Refill: 0   Has an appointment with Dr. Romeo Apple, orthopedist on April 4.  Diclofenac no longer effective for her back pain and knee pain.  5/5 upper and lower extremity strength.  Negative straight leg raise.  Will treat with meloxicam once daily.  Tolerates NSAIDs well, no  history of GI bleed, no cardiac history, blood pressure well controlled.  Agrees with plan of care discussed today. Understands warning signs to seek further care: chest pain, shortness of breath, any significant change in health.  Understands to follow-up if symptoms worsen, do not improve.  Has appointment with Dr. Romeo Apple, orthopedics on April 4.    Novella Olive, NP 03/15/2021

## 2021-03-22 ENCOUNTER — Encounter: Payer: Self-pay | Admitting: Orthopedic Surgery

## 2021-03-22 ENCOUNTER — Ambulatory Visit: Payer: No Typology Code available for payment source

## 2021-03-22 ENCOUNTER — Other Ambulatory Visit: Payer: Self-pay

## 2021-03-22 ENCOUNTER — Ambulatory Visit (INDEPENDENT_AMBULATORY_CARE_PROVIDER_SITE_OTHER): Payer: No Typology Code available for payment source | Admitting: Orthopedic Surgery

## 2021-03-22 VITALS — BP 131/76 | HR 86 | Ht 69.0 in | Wt 273.0 lb

## 2021-03-22 DIAGNOSIS — M545 Low back pain, unspecified: Secondary | ICD-10-CM | POA: Diagnosis not present

## 2021-03-22 DIAGNOSIS — M79605 Pain in left leg: Secondary | ICD-10-CM

## 2021-03-22 MED ORDER — GABAPENTIN 100 MG PO CAPS: 100.0000 mg | ORAL_CAPSULE | Freq: Three times a day (TID) | ORAL | 2 refills | Status: DC

## 2021-03-22 MED ORDER — METHOCARBAMOL 750 MG PO TABS: 750.0000 mg | ORAL_TABLET | Freq: Four times a day (QID) | ORAL | 2 refills | Status: DC

## 2021-03-22 MED ORDER — PREDNISONE 10 MG (48) PO TBPK
ORAL_TABLET | Freq: Every day | ORAL | 0 refills | Status: DC
Start: 2021-03-22 — End: 2021-04-19

## 2021-03-22 NOTE — Patient Instructions (Addendum)
No work part time job    Meds ordered this encounter  Medications  . predniSONE (STERAPRED UNI-PAK 48 TAB) 10 MG (48) TBPK tablet    Sig: Take by mouth daily. 10 mg 12 days as directed    Dispense:  48 tablet    Refill:  0  . gabapentin (NEURONTIN) 100 MG capsule    Sig: Take 1 capsule (100 mg total) by mouth 3 (three) times daily.    Dispense:  90 capsule    Refill:  2  . methocarbamol (ROBAXIN) 750 MG tablet    Sig: Take 1 tablet (750 mg total) by mouth 4 (four) times daily.    Dispense:  60 tablet    Refill:  2   Sciatica  Sciatica is pain, weakness, tingling, or loss of feeling (numbness) along the sciatic nerve. The sciatic nerve starts in the lower back and goes down the back of each leg. Sciatica usually goes away on its own or with treatment. Sometimes, sciatica may come back (recur). What are the causes? This condition happens when the sciatic nerve is pinched or has pressure put on it. This may be the result of:  A disk in between the bones of the spine bulging out too far (herniated disk).  Changes in the spinal disks that occur with aging.  A condition that affects a muscle in the butt.  Extra bone growth near the sciatic nerve.  A break (fracture) of the area between your hip bones (pelvis).  Pregnancy.  Tumor. This is rare. What increases the risk? You are more likely to develop this condition if you:  Play sports that put pressure or stress on the spine.  Have poor strength and ease of movement (flexibility).  Have had a back injury in the past.  Have had back surgery.  Sit for long periods of time.  Do activities that involve bending or lifting over and over again.  Are very overweight (obese). What are the signs or symptoms? Symptoms can vary from mild to very bad. They may include:  Any of these problems in the lower back, leg, hip, or butt: ? Mild tingling, loss of feeling, or dull aches. ? Burning sensations. ? Sharp pains.  Loss of  feeling in the back of the calf or the sole of the foot.  Leg weakness.  Very bad back pain that makes it hard to move. These symptoms may get worse when you cough, sneeze, or laugh. They may also get worse when you sit or stand for long periods of time. How is this treated? This condition often gets better without any treatment. However, treatment may include:  Changing or cutting back on physical activity when you have pain.  Doing exercises and stretching.  Putting ice or heat on the affected area.  Medicines that help: ? To relieve pain and swelling. ? To relax your muscles.  Shots (injections) of medicines that help to relieve pain, irritation, and swelling.  Surgery. Follow these instructions at home: Medicines  Take over-the-counter and prescription medicines only as told by your doctor.  Ask your doctor if the medicine prescribed to you: ? Requires you to avoid driving or using heavy machinery. ? Can cause trouble pooping (constipation). You may need to take these steps to prevent or treat trouble pooping:  Drink enough fluids to keep your pee (urine) pale yellow.  Take over-the-counter or prescription medicines.  Eat foods that are high in fiber. These include beans, whole grains, and fresh fruits and  vegetables.  Limit foods that are high in fat and sugar. These include fried or sweet foods. Managing pain  If told, put ice on the affected area. ? Put ice in a plastic bag. ? Place a towel between your skin and the bag. ? Leave the ice on for 20 minutes, 2-3 times a day.  If told, put heat on the affected area. Use the heat source that your doctor tells you to use, such as a moist heat pack or a heating pad. ? Place a towel between your skin and the heat source. ? Leave the heat on for 20-30 minutes. ? Remove the heat if your skin turns bright red. This is very important if you are unable to feel pain, heat, or cold. You may have a greater risk of getting  burned.      Activity  Return to your normal activities as told by your doctor. Ask your doctor what activities are safe for you.  Avoid activities that make your symptoms worse.  Take short rests during the day. ? When you rest for a long time, do some physical activity or stretching between periods of rest. ? Avoid sitting for a long time without moving. Get up and move around at least one time each hour.  Exercise and stretch regularly, as told by your doctor.  Do not lift anything that is heavier than 10 lb (4.5 kg) while you have symptoms of sciatica. ? Avoid lifting heavy things even when you do not have symptoms. ? Avoid lifting heavy things over and over.  When you lift objects, always lift in a way that is safe for your body. To do this, you should: ? Bend your knees. ? Keep the object close to your body. ? Avoid twisting.   General instructions  Stay at a healthy weight.  Wear comfortable shoes that support your feet. Avoid wearing high heels.  Avoid sleeping on a mattress that is too soft or too hard. You might have less pain if you sleep on a mattress that is firm enough to support your back.  Keep all follow-up visits as told by your doctor. This is important. Contact a doctor if:  You have pain that: ? Wakes you up when you are sleeping. ? Gets worse when you lie down. ? Is worse than the pain you have had in the past. ? Lasts longer than 4 weeks.  You lose weight without trying. Get help right away if:  You cannot control when you pee (urinate) or poop (have a bowel movement).  You have weakness in any of these areas and it gets worse: ? Lower back. ? The area between your hip bones. ? Butt. ? Legs.  You have redness or swelling of your back.  You have a burning feeling when you pee. Summary  Sciatica is pain, weakness, tingling, or loss of feeling (numbness) along the sciatic nerve.  This condition happens when the sciatic nerve is pinched or  has pressure put on it.  Sciatica can cause pain, tingling, or loss of feeling (numbness) in the lower back, legs, hips, and butt.  Treatment often includes rest, exercise, medicines, and putting ice or heat on the affected area. This information is not intended to replace advice given to you by your health care provider. Make sure you discuss any questions you have with your health care provider. Document Revised: 12/24/2018 Document Reviewed: 12/24/2018 Elsevier Patient Education  2021 [employer].

## 2021-03-22 NOTE — Progress Notes (Signed)
Chief Complaint  Patient presents with  . Back Pain    Into entire left leg states leg feels swollen pain from back     3 weeks of pain lower back   First onset of pain running down the leg and giving way of the leg   45 year old female acute onset of lower back pain and radiating left leg pain with weakness and giving way left lower extremity about 3 weeks ago.  She went to primary care they put her on meloxicam instead of diclofenac without much relief  She is having some trouble sleeping at night so we will get some x-rays  Past Medical History:  Diagnosis Date  . Arthritis       Current Outpatient Medications:  .  meloxicam (MOBIC) 15 MG tablet, Take 1 tablet (15 mg total) by mouth daily., Disp: 30 tablet, Rfl: 0 .  omeprazole (PRILOSEC) 40 MG capsule, TAKE 1 CAPSULE BY MOUTH EVERY DAY, Disp: 90 capsule, Rfl: 1  Past Medical History:  Diagnosis Date  . Arthritis     BP 131/76   Pulse 86   Ht 5\' 9"  (1.753 m)   Wt 273 lb (123.8 kg)   BMI 40.32 kg/m   The patient meets the AMA guidelines for Morbid (severe) obesity with a BMI > 40.0 and I have recommended weight loss.  Physical Exam Constitutional:      General: She is not in acute distress.    Appearance: She is well-developed.     Comments: Well developed, well nourished Normal grooming and hygiene     Cardiovascular:     Comments: No peripheral edema Musculoskeletal:     Comments: Lumbar increased lumbar lordosis tenderness lumbar spine central and L4-5 disc space  Mild straight leg raise positive  Reflexes equal  Strength equal 5/5 lower extremities normal    Skin:    General: Skin is warm and dry.  Neurological:     Mental Status: She is alert and oriented to person, place, and time.     Sensory: No sensory deficit.     Coordination: Coordination normal.     Gait: Gait normal.     Deep Tendon Reflexes: Reflexes are normal and symmetric.  Psychiatric:        Mood and Affect: Mood normal.         Behavior: Behavior normal.        Thought Content: Thought content normal.        Judgment: Judgment normal.     Comments: Affect normal     X-rays were done in the office there may be a L4-L5 spondylolysis  There is some coronal plane malalignment may be due to muscle spasm increased lumbar lordosis the spaces are clean  Encounter Diagnosis  Name Primary?  . Low back pain radiating to left leg Yes    Recommend the following medications Rest  Meds ordered this encounter  Medications  . predniSONE (STERAPRED UNI-PAK 48 TAB) 10 MG (48) TBPK tablet    Sig: Take by mouth daily. 10 mg 12 days as directed    Dispense:  48 tablet    Refill:  0  . gabapentin (NEURONTIN) 100 MG capsule    Sig: Take 1 capsule (100 mg total) by mouth 3 (three) times daily.    Dispense:  90 capsule    Refill:  2  . methocarbamol (ROBAXIN) 750 MG tablet    Sig: Take 1 tablet (750 mg total) by mouth 4 (four) times daily.  Dispense:  60 tablet    Refill:  2   Encounter Diagnosis  Name Primary?  . Low back pain radiating to left leg Yes    Follow-up in 2 to 3 weeks

## 2021-04-06 ENCOUNTER — Encounter: Payer: Self-pay | Admitting: Family Medicine

## 2021-04-06 ENCOUNTER — Other Ambulatory Visit: Payer: Self-pay | Admitting: Family Medicine

## 2021-04-06 MED ORDER — FLUCONAZOLE 150 MG PO TABS: ORAL_TABLET | ORAL | 0 refills | Status: DC

## 2021-04-13 ENCOUNTER — Other Ambulatory Visit: Payer: Self-pay | Admitting: Family Medicine

## 2021-04-13 DIAGNOSIS — M25562 Pain in left knee: Secondary | ICD-10-CM

## 2021-04-13 DIAGNOSIS — M5442 Lumbago with sciatica, left side: Secondary | ICD-10-CM

## 2021-04-19 ENCOUNTER — Other Ambulatory Visit (HOSPITAL_COMMUNITY): Payer: Self-pay | Admitting: Family Medicine

## 2021-04-19 ENCOUNTER — Encounter: Payer: Self-pay | Admitting: Orthopedic Surgery

## 2021-04-19 ENCOUNTER — Telehealth: Payer: Self-pay | Admitting: Radiology

## 2021-04-19 ENCOUNTER — Ambulatory Visit (INDEPENDENT_AMBULATORY_CARE_PROVIDER_SITE_OTHER): Payer: BLUE CROSS/BLUE SHIELD | Admitting: Orthopedic Surgery

## 2021-04-19 ENCOUNTER — Other Ambulatory Visit: Payer: Self-pay

## 2021-04-19 VITALS — BP 151/100 | HR 98 | Ht 69.0 in | Wt 276.0 lb

## 2021-04-19 DIAGNOSIS — M5116 Intervertebral disc disorders with radiculopathy, lumbar region: Secondary | ICD-10-CM

## 2021-04-19 DIAGNOSIS — Z1231 Encounter for screening mammogram for malignant neoplasm of breast: Secondary | ICD-10-CM

## 2021-04-19 MED ORDER — IBUPROFEN 800 MG PO TABS: 800.0000 mg | ORAL_TABLET | Freq: Three times a day (TID) | ORAL | 0 refills | Status: DC | PRN

## 2021-04-19 MED ORDER — TRAMADOL HCL 50 MG PO TABS: 50.0000 mg | ORAL_TABLET | Freq: Four times a day (QID) | ORAL | 0 refills | Status: AC | PRN

## 2021-04-19 NOTE — Progress Notes (Signed)
Chief Complaint  Patient presents with  . Leg Pain    Has had left leg pain has not been able to walk until today due to pain down leg / feels better today.    7 weeks of pain lower back radiating into the left leg with intermittently progressive symptoms of weakness and difficulty walking   As we noted in the past: First onset of pain running down the leg and giving way of the leg    45 year old female acute onset of lower back pain and radiating left leg pain with weakness and giving way left lower extremity about 3 weeks ago.  She went to primary care they put her on meloxicam instead of diclofenac without much relief   She is having some trouble sleeping at night so we will get some x-rays  We decided to try on a steroid Dosepak 100 mg of Neurontin 3 times a day and 750 mg of Robaxin  That did not relieve her symptoms  She will need to have an MRI done and a referral will need to be made to neurosurgery .   [facility] Radiology Report  Dictated by Dr. Romeo Apple   Chief complaint pain lower back and left leg radiating to the foot  Images: 3 views of the spine lumbar  Alignment on the AP x-ray the coronal plane alignment is abnormal the patient is listing noticeably Sagittal alignment shows increased lordosis at L4-S1  Disc spaces are maintained  Possible spondylolysis at L4-L5  Impression Scoliotic list, most likely reactive Spondylolysis possibly at L4-5  I increased her gabapentin to 300 mg at night change her to ibuprofen and tramadol  MRI ordered  Follow-up after MRI  Meds ordered this encounter  Medications  . ibuprofen (ADVIL) 800 MG tablet    Sig: Take 1 tablet (800 mg total) by mouth every 8 (eight) hours as needed.    Dispense:  30 tablet    Refill:  0  . traMADol (ULTRAM) 50 MG tablet    Sig: Take 1 tablet (50 mg total) by mouth every 6 (six) hours as needed for up to 7 days.    Dispense:  28 tablet    Refill:  0

## 2021-04-19 NOTE — Addendum Note (Signed)
Addended byCaffie Damme on: 04/19/2021 03:31 PM   Modules accepted: Orders

## 2021-04-19 NOTE — Patient Instructions (Addendum)
Stop the Mobic and the Robaxin start Ultram only take that at home at night and then take ibuprofen 800 mg 3 times a day and gabapentin will be 300 mg at night  MRI will be ordered  While we are working on your approval for MRI please go ahead and call to schedule your appointment with [facility] within at least one (1) week.   Central Scheduling [phone]

## 2021-04-19 NOTE — Telephone Encounter (Signed)
Patient told me she has [employer] plan Card not in system I need to get MRI approved Can you get a copy of the card?

## 2021-04-20 NOTE — Telephone Encounter (Signed)
I called patient she will email me what she has.

## 2021-04-28 ENCOUNTER — Ambulatory Visit (HOSPITAL_COMMUNITY)
Admission: RE | Admit: 2021-04-28 | Discharge: 2021-04-28 | Disposition: A | Discharge status: Home / Self Care | Payer: BLUE CROSS/BLUE SHIELD | Source: Ambulatory Visit | Attending: Family Medicine | Admitting: Family Medicine

## 2021-04-28 DIAGNOSIS — Z1231 Encounter for screening mammogram for malignant neoplasm of breast: Secondary | ICD-10-CM | POA: Diagnosis not present

## 2021-05-03 ENCOUNTER — Telehealth: Payer: Self-pay | Admitting: Radiology

## 2021-05-03 NOTE — Telephone Encounter (Signed)
I called patient to let her know will have to cancel the MRI until she can get in touch with the insurance, her plan is not showing in AIM. Left message for her.

## 2021-05-04 ENCOUNTER — Ambulatory Visit (HOSPITAL_COMMUNITY): Payer: BLUE CROSS/BLUE SHIELD

## 2021-05-05 ENCOUNTER — Telehealth: Payer: Self-pay | Admitting: Family Medicine

## 2021-05-05 ENCOUNTER — Telehealth: Payer: Self-pay | Admitting: Orthopedic Surgery

## 2021-05-05 DIAGNOSIS — Z0000 Encounter for general adult medical examination without abnormal findings: Secondary | ICD-10-CM

## 2021-05-05 DIAGNOSIS — Z1329 Encounter for screening for other suspected endocrine disorder: Secondary | ICD-10-CM

## 2021-05-05 DIAGNOSIS — Z79899 Other long term (current) drug therapy: Secondary | ICD-10-CM

## 2021-05-05 DIAGNOSIS — Z13220 Encounter for screening for lipoid disorders: Secondary | ICD-10-CM

## 2021-05-05 DIAGNOSIS — Z01419 Encounter for gynecological examination (general) (routine) without abnormal findings: Secondary | ICD-10-CM

## 2021-05-05 NOTE — Telephone Encounter (Signed)
Patient called back after speaking to you and asks that you call her again when you have a moment  Thanks

## 2021-05-05 NOTE — Telephone Encounter (Signed)
I told her I called AIM again / and she was in system this time so I was able to get it completed. It is approved, told her she can call [facility] the appointment  She voiced understanding.

## 2021-05-05 NOTE — Telephone Encounter (Signed)
Patient has physical on 6/30 and needing labs done

## 2021-05-05 NOTE — Telephone Encounter (Signed)
Last labs completed 05/19/20 Lipid, Hepatic, BMET and Vit D. Please advise. Thank you

## 2021-05-05 NOTE — Telephone Encounter (Signed)
Patient called back to ask if Amy can please return her call in regard to MRI. States she has been out of work awaiting to have the MRI done. States to call number on back of card which should be correct. (*note: fax sent returned again, did not go through)

## 2021-05-06 NOTE — Telephone Encounter (Signed)
Please order: CBC with diff, CMP, Lipid, TSH

## 2021-05-06 NOTE — Telephone Encounter (Signed)
Lab orders placed and verbalized understanding

## 2021-05-19 ENCOUNTER — Other Ambulatory Visit: Payer: Self-pay | Admitting: Orthopedic Surgery

## 2021-05-19 DIAGNOSIS — M5116 Intervertebral disc disorders with radiculopathy, lumbar region: Secondary | ICD-10-CM

## 2021-05-20 ENCOUNTER — Ambulatory Visit (HOSPITAL_COMMUNITY)
Admission: RE | Admit: 2021-05-20 | Discharge: 2021-05-20 | Disposition: A | Discharge status: Home / Self Care | Payer: BLUE CROSS/BLUE SHIELD | Source: Ambulatory Visit | Attending: Orthopedic Surgery | Admitting: Orthopedic Surgery

## 2021-05-20 ENCOUNTER — Other Ambulatory Visit: Payer: Self-pay

## 2021-05-20 DIAGNOSIS — M5116 Intervertebral disc disorders with radiculopathy, lumbar region: Secondary | ICD-10-CM | POA: Diagnosis not present

## 2021-05-20 DIAGNOSIS — M5117 Intervertebral disc disorders with radiculopathy, lumbosacral region: Secondary | ICD-10-CM | POA: Diagnosis not present

## 2021-05-20 DIAGNOSIS — M5115 Intervertebral disc disorders with radiculopathy, thoracolumbar region: Secondary | ICD-10-CM | POA: Diagnosis not present

## 2021-05-20 DIAGNOSIS — M4727 Other spondylosis with radiculopathy, lumbosacral region: Secondary | ICD-10-CM | POA: Diagnosis not present

## 2021-05-21 ENCOUNTER — Telehealth: Payer: Self-pay | Admitting: Radiology

## 2021-05-21 DIAGNOSIS — M5116 Intervertebral disc disorders with radiculopathy, lumbar region: Secondary | ICD-10-CM

## 2021-05-21 NOTE — Telephone Encounter (Signed)
I called her and made the referral

## 2021-05-21 NOTE — Telephone Encounter (Signed)
-----   Message from Vickki Hearing, MD sent at 05/21/2021  7:09 AM EDT ----- Stacie Roberts need afavor   This looks surgical   So I  dont want to wait for her to come in   Can u call her with the result   Tell I recommend neurosurg  And make referral

## 2021-05-24 ENCOUNTER — Ambulatory Visit: Payer: BLUE CROSS/BLUE SHIELD | Admitting: Orthopedic Surgery

## 2021-05-24 ENCOUNTER — Other Ambulatory Visit: Payer: Self-pay

## 2021-05-24 ENCOUNTER — Encounter: Payer: Self-pay | Admitting: Orthopedic Surgery

## 2021-05-24 VITALS — BP 118/78 | HR 81 | Ht 69.0 in | Wt 272.4 lb

## 2021-05-24 DIAGNOSIS — M5116 Intervertebral disc disorders with radiculopathy, lumbar region: Secondary | ICD-10-CM

## 2021-05-24 NOTE — Patient Instructions (Addendum)
Call neurosurgery for appointment  Herniated Disk  A herniated disk, also called a ruptured disk or slipped disk, occurs when a disk in the spine bulges out too far. Between the bones in the spine (vertebrae), there are oval disks that are made of a soft, spongy center filled with liquid that is surrounded by a tough outer ring. The disks connect the vertebrae, help the spine move, and keep the bones from rubbing against each other when you move. When you have a herniated disk, the spongy center of the disk bulges out or breaks through the outer ring. The spongy center can press on a nerve between the vertebrae and cause pain. This can occur anywhere in the back or neck area, but the lower back is most commonly affected. What are the causes? This condition may be caused by:  Age-related wear and tear.  Sudden injury, such as a strain or sprain. What increases the risk? The following factors may make you more likely to develop this condition:  Aging. This is the main risk factor for a herniated disk.  Being a man who is 61-80 years old.  Frequently doing activities that involve heavy lifting, bending, or twisting.  Not getting enough exercise.  Being overweight.  Using tobacco products. What are the signs or symptoms? Symptoms may vary depending on where your herniated disk is located.  A herniated disk in the lower back may cause: ? Sharp pain in part of the hips, buttocks, or legs. ? Sharp pain in the lower back, spreading down through the leg into the foot (sciatica).  A herniated disk in the neck may cause dizziness and vertigo. It may also cause pain or weakness in the neck, shoulder, or upper arm, forearm, or fingers. You may also have muscle weakness. You may have trouble:  Lifting your leg or arm.  Standing on your toes.  Squeezing tightly with one of your hands. Other symptoms may include:  Numbness or tingling in the affected areas of the hands, arms, feet, or  legs.  Inability to control when you urinate or when you have bowel movements. This is a rare but serious sign of a severe herniated disk in the lower back. How is this diagnosed? This condition may be diagnosed based on:  Your symptoms.  Your medical history.  A physical exam. The exam may include: ? A straight-leg test. For this test, you will lie on your back while your health care provider lifts your leg, keeping your knee straight. If you feel pain, you likely have a herniated disk. ? Neurologic tests. These include checking for numbness, reflexes, muscle strength, and problems with posture.  Imaging tests, such as: ? X-rays. ? MRI. ? CT scan. ? Electromyogram (EMG) to check the nerves that control muscles. How is this treated? Treatment for this condition may include:  A period of light, painless activity for a few days to several weeks. Complete bed rest is not recommended. ? If you have a herniated disk in your lower back, avoid sitting as it increases pressure on the disk.  Medicines. These may include: ? NSAIDs, such as ibuprofen, to help reduce pain and swelling. ? Muscle relaxants to prevent sudden tightening of the back muscles (back spasms). ? Prescription pain medicines, if you have severe pain.  Ice or heat therapy.  Steroid injections in the area of the herniated disk. These can help reduce pain and swelling.  Physical therapy to strengthen your back muscles. In many cases, symptoms go away  with treatment over a period of days or weeks. You will most likely be free of symptoms after 3-4 months. If other treatments do not help to relieve your symptoms, you may need surgery. Follow these instructions at home: Medicines  Take over-the-counter and prescription medicines only as told by your health care provider.  Ask your health care provider if the medicine prescribed to you: ? Requires you to avoid driving or using heavy machinery. ? Can cause constipation.  You may need to take these actions to prevent or treat constipation:  Drink enough fluid to keep your urine pale yellow.  Take over-the-counter or prescription medicines.  Eat foods that are high in fiber, such as beans, whole grains, and fresh fruits and vegetables.  Limit foods that are high in fat and processed sugars, such as fried or sweet foods. Managing pain, stiffness, and swelling  If directed, put ice on the painful area. Icing can help to relieve pain. To do this: ? Put ice in a plastic bag. ? Place a towel between your skin and the bag. ? Leave the ice on for 20 minutes, 2-3 times a day. ? Remove the ice if your skin turns bright red. This is very important. If you cannot feel pain, heat, or cold, you have a greater risk of damage to the area.  If directed, apply heat to the painful area as often as told by your health care provider. Heat can reduce the stiffness of your muscles. Use the heat source that your health care provider recommends, such as a moist heat pack or a heating pad. ? Place a towel between your skin and the heat source. ? Leave the heat on for 20-30 minutes. ? Remove the heat if your skin turns bright red. This is especially important if you are unable to feel pain, heat, or cold. You may have a greater risk of getting burned.      Activity  Avoid strict bed rest but only do activities that are painless.  Gradually return to your normal activities and exercise as told by your health care provider. Ask your health care provider what activities and exercises are safe for you.  Use good posture.  Avoid movements that cause pain.  Do not lift anything that is heavier than 10 lb (4.5 kg), or the limit that you are told, until your health care provider says that it is safe.  Do not sit or stand for long periods of time without changing positions.  Do not sit for long periods of time without getting up and moving around.  If physical therapy was  prescribed, do exercises as told by your health care provider.  Aim to strengthen muscles in your back and abdomen with exercises such as swimming or walking. General instructions  Do not use any products that contain nicotine or tobacco, such as cigarettes, e-cigarettes, and chewing tobacco. These products can delay healing. If you need help quitting, ask your health care provider.  Do not wear high-heeled shoes.  Do not sleep on your abdomen.  If you are overweight, work with your health care provider to lose weight safely.  Keep all follow-up visits. This is important. How is this prevented?  Maintain a healthy weight.  Maintain physical fitness. Do at least 150 minutes of moderate-intensity exercise each week, such as brisk walking or water aerobics.  When lifting objects: ? Keep your feet at least shoulder-width apart and tighten the muscles of your abdomen. ? Keep your spine neutral  as you bend your knees and hips. It is important to lift using the strength of your legs, not your back. Do not lock your knees straight out. ? Always ask for help to lift heavy or awkward objects. Contact a health care provider if you:  Have back pain or neck pain that does not get better after 6 weeks.  Have severe pain in your back, neck, legs, or arms.  Develop numbness, tingling, or weakness anywhere in your body. Get help right away if:  You cannot move your arms or legs.  You cannot control when you urinate or have bowel movements.  You feel dizzy or you faint.  You have shortness of breath. These symptoms may represent a serious problem that is an emergency. Do not wait to see if the symptoms will go away. Get medical help right away. Call your local emergency services (911 in the [place] Do not drive yourself to the hospital. Summary  A herniated disk, also called a ruptured disk or slipped disk, occurs when a disk in the spine bulges out too far (herniates).  This condition may  be caused by age-related wear and tear or a sudden injury.  Symptoms may vary depending on where your herniated disk is located.  Treatment may include rest, medicines, ice or heat therapy, steroid injections, and physical therapy.  If other treatments do not help to relieve your symptoms, you may need surgery. This information is not intended to replace advice given to you by your health care provider. Make sure you discuss any questions you have with your health care provider. Document Revised: 03/25/2020 Document Reviewed: 03/25/2020 Elsevier Patient Education  2021 [employer].

## 2021-05-24 NOTE — Progress Notes (Signed)
MRI follow-up  Chief Complaint  Patient presents with  . Back Pain    Its much better./Go over MRI results    45 year old female severe back and leg pain sent for MRI now feels better on ibuprofen and gabapentin and muscle relaxer Robaxin  However her MRI in my opinion and personal readingshows significant disease starting at L4 working through S1 with degenerative disc disease facet arthritis protruding disks and nerve impingement   MRI from 05/21/2019 to report  L4-5: Small left subarticular/foraminal disc protrusion with associated annular tear resulting in moderate left neural foraminal narrowing. No spinal canal stenosis.  L5-S1: Right subarticular disc protrusion causing prominent narrowing of the right subarticular zone, likely impinging on the traversing right S1 nerve root. Mild facet degenerative changes. Mild right neural foraminal narrowing. No spinal canal stenosis.  IMPRESSION: 1. Right subarticular disc protrusion causing prominent narrowing of the right subarticular zone at L5-S1, likely impinging on the traversing right S1 nerve root. 2. Left foraminal disc protrusion at L4-5 resulting in moderate left neural foraminal narrowing. 3. Low lying conus medullaris ending in approximately at the level of the superior endplate of L3. Conus medullaris and cauda equina otherwise appear normal.  I think her symptoms are likely to come back and have asked her to go ahead with a neurosurgery referral in case her symptoms come back as we have maximized her nonoperative treatment

## 2021-06-01 ENCOUNTER — Encounter: Payer: PRIVATE HEALTH INSURANCE | Admitting: Family Medicine

## 2021-06-04 ENCOUNTER — Encounter: Payer: PRIVATE HEALTH INSURANCE | Admitting: Nurse Practitioner

## 2021-06-13 ENCOUNTER — Other Ambulatory Visit: Payer: Self-pay | Admitting: Orthopedic Surgery

## 2021-06-13 DIAGNOSIS — M5116 Intervertebral disc disorders with radiculopathy, lumbar region: Secondary | ICD-10-CM

## 2021-06-14 DIAGNOSIS — Z01419 Encounter for gynecological examination (general) (routine) without abnormal findings: Secondary | ICD-10-CM | POA: Diagnosis not present

## 2021-06-14 DIAGNOSIS — Z13220 Encounter for screening for lipoid disorders: Secondary | ICD-10-CM | POA: Diagnosis not present

## 2021-06-14 DIAGNOSIS — Z79899 Other long term (current) drug therapy: Secondary | ICD-10-CM | POA: Diagnosis not present

## 2021-06-14 DIAGNOSIS — Z0000 Encounter for general adult medical examination without abnormal findings: Secondary | ICD-10-CM | POA: Diagnosis not present

## 2021-06-15 LAB — COMPREHENSIVE METABOLIC PANEL
ALT: 16 IU/L (ref 0–32)
AST: 16 IU/L (ref 0–40)
Albumin/Globulin Ratio: 1.2 (ref 1.2–2.2)
Albumin: 3.5 g/dL — ABNORMAL LOW (ref 3.8–4.8)
Alkaline Phosphatase: 79 IU/L (ref 44–121)
BUN/Creatinine Ratio: 9 (ref 9–23)
BUN: 7 mg/dL (ref 6–24)
Bilirubin Total: 0.7 mg/dL (ref 0.0–1.2)
CO2: 25 mmol/L (ref 20–29)
Calcium: 8.6 mg/dL — ABNORMAL LOW (ref 8.7–10.2)
Chloride: 105 mmol/L (ref 96–106)
Creatinine, Ser: 0.78 mg/dL (ref 0.57–1.00)
Globulin, Total: 2.9 g/dL (ref 1.5–4.5)
Glucose: 101 mg/dL — ABNORMAL HIGH (ref 65–99)
Potassium: 4.3 mmol/L (ref 3.5–5.2)
Sodium: 141 mmol/L (ref 134–144)
Total Protein: 6.4 g/dL (ref 6.0–8.5)
eGFR: 95 mL/min/{1.73_m2} (ref >59)

## 2021-06-15 LAB — LIPID PANEL
Chol/HDL Ratio: 4.3 ratio (ref 0.0–4.4)
Cholesterol, Total: 156 mg/dL (ref 100–199)
HDL: 36 mg/dL — ABNORMAL LOW (ref >39)
LDL Chol Calc (NIH): 101 mg/dL — ABNORMAL HIGH (ref 0–99)
Triglycerides: 105 mg/dL (ref 0–149)
VLDL Cholesterol Cal: 19 mg/dL (ref 5–40)

## 2021-06-15 LAB — TSH: TSH: 1.66 u[IU]/mL (ref 0.450–4.500)

## 2021-06-15 LAB — CBC WITH DIFFERENTIAL/PLATELET
Basophils Absolute: 0 10*3/uL (ref 0.0–0.2)
Basos: 1 %
EOS (ABSOLUTE): 0 10*3/uL (ref 0.0–0.4)
Eos: 0 %
Hematocrit: 41.7 % (ref 34.0–46.6)
Hemoglobin: 14 g/dL (ref 11.1–15.9)
Immature Grans (Abs): 0 10*3/uL (ref 0.0–0.1)
Immature Granulocytes: 0 %
Lymphocytes Absolute: 2.4 10*3/uL (ref 0.7–3.1)
Lymphs: 31 %
MCH: 31.7 pg (ref 26.6–33.0)
MCHC: 33.6 g/dL (ref 31.5–35.7)
MCV: 95 fL (ref 79–97)
Monocytes Absolute: 0.4 10*3/uL (ref 0.1–0.9)
Monocytes: 5 %
Neutrophils Absolute: 5.1 10*3/uL (ref 1.4–7.0)
Neutrophils: 63 %
Platelets: 256 10*3/uL (ref 150–450)
RBC: 4.41 x10E6/uL (ref 3.77–5.28)
RDW: 11.8 % (ref 11.7–15.4)
WBC: 8 10*3/uL (ref 3.4–10.8)

## 2021-06-17 ENCOUNTER — Other Ambulatory Visit: Payer: Self-pay

## 2021-06-17 ENCOUNTER — Encounter: Payer: Self-pay | Admitting: Nurse Practitioner

## 2021-06-17 ENCOUNTER — Ambulatory Visit (INDEPENDENT_AMBULATORY_CARE_PROVIDER_SITE_OTHER): Payer: BLUE CROSS/BLUE SHIELD | Admitting: Nurse Practitioner

## 2021-06-17 VITALS — BP 123/85 | HR 66 | Temp 97.5°F | Ht 69.0 in | Wt 277.0 lb

## 2021-06-17 DIAGNOSIS — Z01419 Encounter for gynecological examination (general) (routine) without abnormal findings: Secondary | ICD-10-CM | POA: Diagnosis not present

## 2021-06-17 DIAGNOSIS — Z01411 Encounter for gynecological examination (general) (routine) with abnormal findings: Secondary | ICD-10-CM | POA: Diagnosis not present

## 2021-06-17 DIAGNOSIS — Z113 Encounter for screening for infections with a predominantly sexual mode of transmission: Secondary | ICD-10-CM

## 2021-06-17 DIAGNOSIS — Z124 Encounter for screening for malignant neoplasm of cervix: Secondary | ICD-10-CM | POA: Diagnosis not present

## 2021-06-17 DIAGNOSIS — A599 Trichomoniasis, unspecified: Secondary | ICD-10-CM | POA: Diagnosis not present

## 2021-06-17 DIAGNOSIS — Z1151 Encounter for screening for human papillomavirus (HPV): Secondary | ICD-10-CM

## 2021-06-17 LAB — POCT WET PREP WITH KOH
Clue Cells Wet Prep HPF POC: POSITIVE
KOH Prep POC: POSITIVE — AB
RBC Wet Prep HPF POC: NEGATIVE
Trichomonas, UA: POSITIVE
pH, Wet Prep: 6

## 2021-06-17 MED ORDER — METRONIDAZOLE 500 MG PO TABS: 500.0000 mg | ORAL_TABLET | Freq: Two times a day (BID) | ORAL | 0 refills | Status: DC

## 2021-06-17 MED ORDER — PHENTERMINE HCL 37.5 MG PO TABS: 37.5000 mg | ORAL_TABLET | Freq: Every day | ORAL | 2 refills | Status: DC

## 2021-06-17 NOTE — Progress Notes (Signed)
Subjective:    Patient ID: Stacie Roberts, female    DOB: November 16, 1976, 45 y.o.   MRN: [id number]  HPI The patient comes in today for a wellness visit.    A review of their health history was completed.  A review of medications was also completed.  Any needed refills; no  Eating habits: trying to eat healthy  Falls/  MVA accidents in past few months: no  Regular exercise: just started back exercising; back pain slowly improving; best tolerated exercise is the treadmill  Specialist pt sees on regular basis: ortho- Dr Romeo Apple for herniated disc  Preventative health issues were discussed.   Additional concerns: would like to restart phentermine to help with weight Is no longer with her previous sexual partner; no vaginal bleeding or pelvic pain; due for her Mirena to be removed and replaced Some pain due to limited exercise over the past few months as well as working at home, doing frequent snacking. Would like to restart phentermine to help jumpstart her weight loss.  Denies any adverse effects from taking this previously. Regular vision and dental exams. Depression screen [facility] 2/9 06/17/2021 03/15/2021 05/29/2020 06/25/2018 05/29/2017  Decreased Interest 0 0 0 0 0  Down, Depressed, Hopeless 0 0 0 0 0  PHQ - 2 Score 0 0 0 0 0  Altered sleeping - - 1 - -  Tired, decreased energy - - 0 - -  Change in appetite - - 0 - -  Feeling bad or failure about yourself  - - 0 - -  Trouble concentrating - - 0 - -  Moving slowly or fidgety/restless - - 0 - -  Suicidal thoughts - - 0 - -  PHQ-9 Score - - 1 - -  Difficult doing work/chores - - Not difficult at all - -     Review of Systems  Constitutional:  Negative for activity change and appetite change.  HENT:  Negative for sore throat and trouble swallowing.   Respiratory:  Negative for cough, chest tightness, shortness of breath and wheezing.   Cardiovascular:  Negative for chest pain.  Gastrointestinal:  Negative for abdominal  distention, abdominal pain, blood in stool, constipation, diarrhea, nausea and vomiting.  Genitourinary:  Negative for difficulty urinating, dyspareunia, dysuria, enuresis, frequency, genital sores, menstrual problem, pelvic pain, urgency, vaginal bleeding and vaginal discharge.  Musculoskeletal:  Positive for back pain.      Objective:   Physical Exam Constitutional:      General: She is not in acute distress.    Appearance: She is well-developed.  Neck:     Thyroid: No thyromegaly.     Trachea: No tracheal deviation.     Comments: Thyroid non tender to palpation. No mass or goiter noted.  Cardiovascular:     Rate and Rhythm: Normal rate and regular rhythm.     Heart sounds: Normal heart sounds. No murmur heard. Pulmonary:     Effort: Pulmonary effort is normal.     Breath sounds: Normal breath sounds.  Chest:  Breasts:    Right: No swelling, inverted nipple, mass, skin change, tenderness, axillary adenopathy or supraclavicular adenopathy.     Left: No swelling, inverted nipple, mass, skin change, tenderness, axillary adenopathy or supraclavicular adenopathy.  Abdominal:     General: There is no distension.     Palpations: Abdomen is soft.     Tenderness: There is no abdominal tenderness.  Genitourinary:    Vagina: Normal.     Comments: External GU no rashes  or lesions.  Vagina small amount of yellowish-green discharge.  Slight odor noted.  No CMT.  Cervix and vaginal tissue normal.  Bimanual exam no tenderness or obvious masses.  Exam limited due to abdominal girth. Musculoskeletal:     Cervical back: Normal range of motion and neck supple.  Lymphadenopathy:     Cervical: No cervical adenopathy.     Upper Body:     Right upper body: No supraclavicular, axillary or pectoral adenopathy.     Left upper body: No supraclavicular, axillary or pectoral adenopathy.  Skin:    General: Skin is warm and dry.     Findings: No rash.  Neurological:     Mental Status: She is alert and  oriented to person, place, and time.  Psychiatric:        Mood and Affect: Mood normal.        Behavior: Behavior normal.        Thought Content: Thought content normal.        Judgment: Judgment normal.  Today's Vitals   06/17/21 0815  BP: 123/85  Pulse: 66  Temp: (!) 97.5 F (36.4 C)  TempSrc: Oral  Weight: 277 lb (125.6 kg)  Height: 5\' 9"  (1.753 m)   Body mass index is 40.91 kg/m.  Results for orders placed or performed in visit on 06/17/21  POCT Wet Prep with KOH  Result Value Ref Range   Trichomonas, UA Positive    Clue Cells Wet Prep HPF POC positive    Epithelial Wet Prep HPF POC Moderate Few, Moderate, Many, Too numerous to count   Yeast Wet Prep HPF POC     Bacteria Wet Prep HPF POC Many (A) Few   RBC Wet Prep HPF POC neg    WBC Wet Prep HPF POC TNTC    KOH Prep POC Positive (A) Negative   pH, Wet Prep 6.0           Assessment & Plan:   Problem List Items Addressed This Visit       Other   Morbid obesity (HCC) (Chronic)   Relevant Medications   phentermine (ADIPEX-P) 37.5 MG tablet   Other Visit Diagnoses     Well woman exam    -  Primary   Relevant Orders   IGP,CtNg,AptimaHPV,rfx16/18,45   Screening for cervical cancer       Relevant Orders   IGP,CtNg,AptimaHPV,rfx16/18,45   Screening for HPV (human papillomavirus)       Relevant Orders   IGP,CtNg,AptimaHPV,rfx16/18,45   Routine screening for STI (sexually transmitted infection)       Relevant Orders   IGP,CtNg,AptimaHPV,rfx16/18,45   Trichomoniasis       Relevant Medications   metroNIDAZOLE (FLAGYL) 500 MG tablet      Meds ordered this encounter  Medications   metroNIDAZOLE (FLAGYL) 500 MG tablet    Sig: Take 1 tablet (500 mg total) by mouth 2 (two) times daily with a meal.    Dispense:  14 tablet    Refill:  0    Order Specific Question:   Supervising Provider    Answer:   06/19/21 A [[id number]]   phentermine (ADIPEX-P) 37.5 MG tablet    Sig: Take 1 tablet (37.5 mg total) by  mouth daily before breakfast.    Dispense:  30 tablet    Refill:  2    Order Specific Question:   Supervising Provider    Answer:   Lilyan Punt A [[id number]]   Continue activity and weight loss efforts.  Restart phentermine as directed to see if this will help. Metronidazole as directed for trichomoniasis.  Patient is no longer sexually active with this partner but advised to make sure he gets treated to avoid reinfection.  Also recommend that she notify him so that he can be treated as well. Patient to contact gynecologist in [place] that placed her IUD for removal and insertion of a new Mirena.  She agrees with this plan. Gonorrhea and Chlamydia testing added to her Pap smear, patient defers other testing at this time. Return in about 3 months (around 09/17/2021) for Weight recheck.

## 2021-06-18 ENCOUNTER — Telehealth: Payer: Self-pay | Admitting: Family Medicine

## 2021-06-18 NOTE — Telephone Encounter (Signed)
Patient requesting pap results sent to Dr. Francee Piccolo for her IUD to be replaced. She seen Eber Jones for her physical on 06/17/2021 fax /# [phone].  CB# [phone]

## 2021-06-18 NOTE — Telephone Encounter (Signed)
Results not resulted at this time. Once resulted will fax to Dr.McCloud

## 2021-06-23 NOTE — Telephone Encounter (Signed)
Called lab bc no results are available and it was collected on 6/30. Told it was still pending for HPV should be ready in about 3 days per [facility].

## 2021-06-24 LAB — IGP,CTNG,APTIMAHPV,RFX16/18,45
Chlamydia, Nuc. Acid Amp: NEGATIVE
Gonococcus by Nucleic Acid Amp: NEGATIVE
HPV Aptima: POSITIVE — AB
HPV Genotype 16: NEGATIVE
HPV Genotype 18,45: NEGATIVE

## 2021-06-26 ENCOUNTER — Other Ambulatory Visit: Payer: Self-pay | Admitting: Orthopedic Surgery

## 2021-06-26 DIAGNOSIS — M79605 Pain in left leg: Secondary | ICD-10-CM

## 2021-06-27 NOTE — Telephone Encounter (Signed)
Note sent to patient regarding PAP results.

## 2021-07-05 DIAGNOSIS — Z6841 Body Mass Index (BMI) 40.0 and over, adult: Secondary | ICD-10-CM | POA: Diagnosis not present

## 2021-07-05 DIAGNOSIS — Z975 Presence of (intrauterine) contraceptive device: Secondary | ICD-10-CM | POA: Diagnosis not present

## 2021-07-16 ENCOUNTER — Other Ambulatory Visit: Payer: Self-pay | Admitting: Orthopedic Surgery

## 2021-07-16 DIAGNOSIS — M545 Low back pain, unspecified: Secondary | ICD-10-CM

## 2021-07-20 ENCOUNTER — Telehealth: Payer: Self-pay | Admitting: Radiology

## 2021-07-20 NOTE — Telephone Encounter (Signed)
Closing referral to Neurosurgery.  Patient refuses to call back and schedule [facility] has called x4 and I have spoken to her also.  To you FYI

## 2021-07-21 ENCOUNTER — Other Ambulatory Visit: Payer: Self-pay | Admitting: Orthopedic Surgery

## 2021-07-21 DIAGNOSIS — M5116 Intervertebral disc disorders with radiculopathy, lumbar region: Secondary | ICD-10-CM

## 2021-08-19 ENCOUNTER — Other Ambulatory Visit: Payer: Self-pay | Admitting: Orthopedic Surgery

## 2021-08-19 DIAGNOSIS — M5116 Intervertebral disc disorders with radiculopathy, lumbar region: Secondary | ICD-10-CM

## 2021-09-09 ENCOUNTER — Other Ambulatory Visit: Payer: Self-pay | Admitting: Nurse Practitioner

## 2021-09-17 ENCOUNTER — Encounter: Payer: Self-pay | Admitting: Nurse Practitioner

## 2021-09-17 ENCOUNTER — Other Ambulatory Visit: Payer: Self-pay

## 2021-09-17 ENCOUNTER — Ambulatory Visit (INDEPENDENT_AMBULATORY_CARE_PROVIDER_SITE_OTHER): Payer: BLUE CROSS/BLUE SHIELD | Admitting: Nurse Practitioner

## 2021-09-17 VITALS — BP 116/79 | HR 84 | Temp 97.5°F | Wt 269.4 lb

## 2021-09-17 DIAGNOSIS — Z23 Encounter for immunization: Secondary | ICD-10-CM

## 2021-09-17 DIAGNOSIS — K219 Gastro-esophageal reflux disease without esophagitis: Secondary | ICD-10-CM

## 2021-09-17 MED ORDER — PHENTERMINE HCL 37.5 MG PO TABS: 37.5000 mg | ORAL_TABLET | Freq: Every day | ORAL | 2 refills | Status: DC

## 2021-09-17 NOTE — Progress Notes (Signed)
   Subjective:    Patient ID: Stacie Roberts, female    DOB: 1976-03-06, 45 y.o.   MRN: [id number]  HPI Pt here for follow up on Phentermine. Pt states "so far so good". Pt has been taking  1/2 Phentermine tablet but today started taking one whole tablet.  No chest pain or palpitations.  Denies any side effects from phentermine. Going to the gym and working out on a regular basis.  Has been trying to do better with her diet.  Has experienced an exacerbation of her reflux lately.  States she is drinking a large amount of caffeine.  No significant tobacco use, states she smokes a "rare cigar.  Taking daily omeprazole.       Objective:   Physical Exam NAD.  Alert, oriented.  Lungs clear.  Heart regular rate rhythm.  Abdomen soft nondistended nontender. Today's Vitals   09/17/21 0859  BP: 116/79  Pulse: 84  Temp: (!) 97.5 F (36.4 C)  SpO2: 99%  Weight: 269 lb 6.4 oz (122.2 kg)   Body mass index is 39.78 kg/m.       Assessment & Plan:   Problem List Items Addressed This Visit       Digestive   GERD (gastroesophageal reflux disease)     Other   Morbid obesity (HCC) (Chronic)   Relevant Medications   phentermine (ADIPEX-P) 37.5 MG tablet   Other Visit Diagnoses     Need for vaccination    -  Primary   Relevant Orders   Flu Vaccine QUAD 6+ mos PF IM (Fluarix Quad PF) (Completed)      Meds ordered this encounter  Medications   phentermine (ADIPEX-P) 37.5 MG tablet    Sig: Take 1 tablet (37.5 mg total) by mouth daily before breakfast.    Dispense:  30 tablet    Refill:  2    Order Specific Question:   Supervising Provider    Answer:   Lilyan Punt A [[id number]]   Continue omeprazole as directed.  Reviewed dietary measures and lifestyle factors affecting her symptoms.  Recommend she slowly reduce amount of caffeine in her diet.  Call back if persists. Continue phentermine for 3 more months.  Continue regular activity and healthy diet.  Avoid any sugary beverages or  fruit juices. After 3 months patient to consider starting Wegovy if her insurance will cover this.

## 2021-09-17 NOTE — Patient Instructions (Signed)
Wegovy

## 2021-10-08 ENCOUNTER — Other Ambulatory Visit: Payer: Self-pay | Admitting: Orthopedic Surgery

## 2021-10-08 DIAGNOSIS — M5116 Intervertebral disc disorders with radiculopathy, lumbar region: Secondary | ICD-10-CM

## 2021-10-21 ENCOUNTER — Other Ambulatory Visit: Payer: Self-pay | Admitting: Orthopedic Surgery

## 2021-10-21 DIAGNOSIS — M545 Low back pain, unspecified: Secondary | ICD-10-CM

## 2021-12-04 ENCOUNTER — Other Ambulatory Visit: Payer: Self-pay | Admitting: Orthopedic Surgery

## 2021-12-04 ENCOUNTER — Other Ambulatory Visit: Payer: Self-pay | Admitting: Nurse Practitioner

## 2021-12-04 DIAGNOSIS — M5116 Intervertebral disc disorders with radiculopathy, lumbar region: Secondary | ICD-10-CM

## 2021-12-08 ENCOUNTER — Telehealth: Payer: Self-pay | Admitting: Family Medicine

## 2021-12-08 NOTE — Telephone Encounter (Signed)
Patient is requesting refill on phentemine 37.5 mg called into [facility]

## 2021-12-14 ENCOUNTER — Encounter: Payer: Self-pay | Admitting: Nurse Practitioner

## 2021-12-14 NOTE — Telephone Encounter (Signed)
Message sent through my chart

## 2022-01-03 ENCOUNTER — Other Ambulatory Visit: Payer: Self-pay | Admitting: Orthopedic Surgery

## 2022-01-03 DIAGNOSIS — M5116 Intervertebral disc disorders with radiculopathy, lumbar region: Secondary | ICD-10-CM

## 2022-01-31 ENCOUNTER — Encounter: Payer: Self-pay | Admitting: Nurse Practitioner

## 2022-03-01 ENCOUNTER — Other Ambulatory Visit: Payer: Self-pay | Admitting: Orthopedic Surgery

## 2022-03-01 DIAGNOSIS — M79605 Pain in left leg: Secondary | ICD-10-CM

## 2022-03-09 ENCOUNTER — Encounter: Payer: Self-pay | Admitting: Nurse Practitioner

## 2022-03-11 ENCOUNTER — Other Ambulatory Visit: Payer: Self-pay | Admitting: Nurse Practitioner

## 2022-03-11 MED ORDER — WEGOVY 0.25 MG/0.5ML ~~LOC~~ SOAJ: 0.2500 mg | SUBCUTANEOUS | 0 refills | Status: DC

## 2022-03-15 ENCOUNTER — Other Ambulatory Visit (HOSPITAL_COMMUNITY): Payer: Self-pay | Admitting: Nurse Practitioner

## 2022-03-15 DIAGNOSIS — Z1231 Encounter for screening mammogram for malignant neoplasm of breast: Secondary | ICD-10-CM

## 2022-03-18 ENCOUNTER — Other Ambulatory Visit: Payer: Self-pay | Admitting: Nurse Practitioner

## 2022-03-18 ENCOUNTER — Telehealth: Payer: Self-pay

## 2022-03-18 ENCOUNTER — Encounter: Payer: Self-pay | Admitting: Nurse Practitioner

## 2022-03-18 MED ORDER — OZEMPIC (0.25 OR 0.5 MG/DOSE) 2 MG/1.5ML ~~LOC~~ SOPN: 0.2500 mg | PEN_INJECTOR | SUBCUTANEOUS | 0 refills | Status: DC

## 2022-03-18 NOTE — Telephone Encounter (Signed)
Caller name:Nicolas Sester  ? ?On DPR? :Yes ? ?Call back number:[phone] ? ?Provider they see: Hoyle Sauer  ? ?Reason for call:Pt dropped off form to be completed for Disability Parking pt asked to give to Hoyle Sauer I have placed in folder to be signed.  ? ?

## 2022-03-18 NOTE — Telephone Encounter (Signed)
Please advise. Thank you

## 2022-03-21 ENCOUNTER — Other Ambulatory Visit: Payer: Self-pay | Admitting: Nurse Practitioner

## 2022-03-22 ENCOUNTER — Telehealth: Payer: Self-pay | Admitting: Nurse Practitioner

## 2022-03-22 NOTE — Telephone Encounter (Signed)
PA for Ozempic 0.25-0.5 mg denied. Per fax, denied because "we did not see what we need to approve the drug your asked for. We may be able to approve this drug in a certain situation when the requested drug will not be use for obesity. Fax in provider office for review. Please advise. Thank you ?

## 2022-03-23 ENCOUNTER — Other Ambulatory Visit: Payer: Self-pay | Admitting: Nurse Practitioner

## 2022-03-28 NOTE — Telephone Encounter (Signed)
Patient stated she will call to see if any medications for obesity are covered by her plan but she is not sure if anything is covered- patient interested in going back on her Phentermine  ?

## 2022-03-28 NOTE — Telephone Encounter (Signed)
Unfortunately, her insurance will not cover regular Ozempic since she is not diabetic. If there is another preferred on her formulary, we can try again. Please let her know. Thanks, Eber Jones  ?

## 2022-04-02 ENCOUNTER — Other Ambulatory Visit: Payer: Self-pay | Admitting: Nurse Practitioner

## 2022-04-02 MED ORDER — PHENTERMINE HCL 37.5 MG PO TABS: 37.5000 mg | ORAL_TABLET | Freq: Every day | ORAL | 0 refills | Status: DC

## 2022-04-02 NOTE — Telephone Encounter (Signed)
Done

## 2022-04-24 ENCOUNTER — Other Ambulatory Visit: Payer: Self-pay | Admitting: Nurse Practitioner

## 2022-04-29 ENCOUNTER — Other Ambulatory Visit: Payer: Self-pay | Admitting: Nurse Practitioner

## 2022-05-02 ENCOUNTER — Ambulatory Visit (HOSPITAL_COMMUNITY)
Admission: RE | Admit: 2022-05-02 | Discharge: 2022-05-02 | Disposition: A | Discharge status: Home / Self Care | Payer: BLUE CROSS/BLUE SHIELD | Source: Ambulatory Visit | Attending: Nurse Practitioner | Admitting: Nurse Practitioner

## 2022-05-02 DIAGNOSIS — Z1231 Encounter for screening mammogram for malignant neoplasm of breast: Secondary | ICD-10-CM | POA: Diagnosis not present

## 2022-05-16 ENCOUNTER — Other Ambulatory Visit: Payer: Self-pay | Admitting: Orthopedic Surgery

## 2022-05-16 ENCOUNTER — Other Ambulatory Visit: Payer: Self-pay | Admitting: Nurse Practitioner

## 2022-05-16 DIAGNOSIS — M5116 Intervertebral disc disorders with radiculopathy, lumbar region: Secondary | ICD-10-CM

## 2022-06-15 ENCOUNTER — Other Ambulatory Visit: Payer: Self-pay | Admitting: Orthopedic Surgery

## 2022-06-15 DIAGNOSIS — M79605 Pain in left leg: Secondary | ICD-10-CM

## 2022-06-24 ENCOUNTER — Ambulatory Visit (INDEPENDENT_AMBULATORY_CARE_PROVIDER_SITE_OTHER): Payer: BLUE CROSS/BLUE SHIELD | Admitting: Nurse Practitioner

## 2022-06-24 ENCOUNTER — Encounter: Payer: Self-pay | Admitting: Nurse Practitioner

## 2022-06-24 VITALS — BP 109/68 | HR 68 | Temp 97.9°F | Ht 69.0 in | Wt 246.0 lb

## 2022-06-24 DIAGNOSIS — Z113 Encounter for screening for infections with a predominantly sexual mode of transmission: Secondary | ICD-10-CM

## 2022-06-24 DIAGNOSIS — Z01419 Encounter for gynecological examination (general) (routine) without abnormal findings: Secondary | ICD-10-CM | POA: Diagnosis not present

## 2022-06-24 DIAGNOSIS — N760 Acute vaginitis: Secondary | ICD-10-CM

## 2022-06-24 DIAGNOSIS — Z1151 Encounter for screening for human papillomavirus (HPV): Secondary | ICD-10-CM

## 2022-06-24 DIAGNOSIS — E559 Vitamin D deficiency, unspecified: Secondary | ICD-10-CM

## 2022-06-24 DIAGNOSIS — B9689 Other specified bacterial agents as the cause of diseases classified elsewhere: Secondary | ICD-10-CM

## 2022-06-24 DIAGNOSIS — M5442 Lumbago with sciatica, left side: Secondary | ICD-10-CM

## 2022-06-24 DIAGNOSIS — Z124 Encounter for screening for malignant neoplasm of cervix: Secondary | ICD-10-CM | POA: Diagnosis not present

## 2022-06-24 LAB — POCT WET PREP WITH KOH
KOH Prep POC: POSITIVE — AB
Trichomonas, UA: NEGATIVE
pH, Wet Prep: 5.5

## 2022-06-24 MED ORDER — GABAPENTIN 300 MG PO CAPS: 300.0000 mg | ORAL_CAPSULE | Freq: Two times a day (BID) | ORAL | 0 refills | Status: DC

## 2022-06-24 MED ORDER — METRONIDAZOLE 500 MG PO TABS: 500.0000 mg | ORAL_TABLET | Freq: Two times a day (BID) | ORAL | 0 refills | Status: DC

## 2022-06-24 NOTE — Progress Notes (Signed)
Subjective:    Patient ID: Stacie Roberts, female    DOB: [birth year], 46 y.o.   MRN: [id number]  HPI  The patient comes in today for a wellness visit.    A review of their health history was completed.  A review of medications was also completed.   Eating habits: good  Falls/  MVA accidents in past few months: no  Regular exercise: 3 to 5 days per week  Specialist pt sees on regular basis: orthopedic prn- disc   Preventative health issues were discussed.   Additional concerns: no No bleeding or pelvic pain with IUD. Has an appointment to have it replaced at [facility] in [place]. No new sexual partners.  GERD has improved on Omeprazole. Worse with certain foods such as candy or ice cream. Minimal caffeine intake.  Regular vision and dental exams.  History of herniated disk. No relief with Gabapentin 100 mg prescribed by specialist. Wants to know if she should try this again.  Review of Systems  Constitutional:  Negative for activity change, appetite change and fatigue.  HENT:  Negative for sore throat and trouble swallowing.   Respiratory:  Negative for cough, chest tightness, shortness of breath and wheezing.   Cardiovascular:  Negative for chest pain.  Gastrointestinal:  Negative for abdominal distention, abdominal pain, constipation, diarrhea, nausea and vomiting.  Genitourinary:  Negative for difficulty urinating, dysuria, enuresis, frequency, genital sores, menstrual problem, pelvic pain, urgency, vaginal bleeding and vaginal discharge.  Musculoskeletal:  Positive for back pain.      06/24/2022    9:08 AM  Depression screen PHQ 2/9  Decreased Interest 0  Down, Depressed, Hopeless 0  PHQ - 2 Score 0        Objective:   Physical Exam Vitals and nursing note reviewed.  Constitutional:      General: She is not in acute distress.    Appearance: She is well-developed.  Neck:     Thyroid: No thyromegaly.     Trachea: No tracheal deviation.     Comments: Thyroid  non tender to palpation. No mass or goiter noted.  Cardiovascular:     Rate and Rhythm: Normal rate and regular rhythm.     Heart sounds: Normal heart sounds. No murmur heard. Pulmonary:     Effort: Pulmonary effort is normal.     Breath sounds: Normal breath sounds.  Chest:  Breasts:    Right: No swelling, inverted nipple, mass, skin change or tenderness.     Left: No swelling, inverted nipple, mass, skin change or tenderness.  Abdominal:     General: There is no distension.     Palpations: Abdomen is soft.     Tenderness: There is no abdominal tenderness.  Genitourinary:    Comments: External GU: no rashes or lesions. Vagina: large amount white mucoid discharge with slight yellowish color. No CMT. Bimanual exam: no tenderness or obvious masses. Exam limited due to abdominal girth.  Musculoskeletal:     Cervical back: Normal range of motion and neck supple.  Lymphadenopathy:     Cervical: No cervical adenopathy.     Upper Body:     Right upper body: No supraclavicular, axillary or pectoral adenopathy.     Left upper body: No supraclavicular, axillary or pectoral adenopathy.  Skin:    General: Skin is warm and dry.     Findings: No rash.  Neurological:     Mental Status: She is alert and oriented to person, place, and time.  Psychiatric:  Mood and Affect: Mood normal.        Behavior: Behavior normal.        Thought Content: Thought content normal.        Judgment: Judgment normal.    Results for orders placed or performed in visit on 06/24/22  POCT Wet Prep with KOH  Result Value Ref Range   Trichomonas, UA Negative    Clue Cells Wet Prep HPF POC     Epithelial Wet Prep HPF POC     Yeast Wet Prep HPF POC     Bacteria Wet Prep HPF POC     RBC Wet Prep HPF POC     WBC Wet Prep HPF POC     KOH Prep POC Positive (A) Negative   pH, Wet Prep 5.5    Today's Vitals   06/24/22 0816  BP: 109/68  Pulse: 68  Temp: 97.9 F (36.6 C)  SpO2: 100%  Weight: 246 lb (111.6  kg)  Height: 5\' 9"  (1.753 m)   Body mass index is 36.33 kg/m.        Assessment & Plan:   Problem List Items Addressed This Visit       Nervous and Auditory   Acute midline low back pain with left-sided sciatica   Relevant Medications   gabapentin (NEURONTIN) 300 MG capsule     Other   Vitamin D deficiency   Relevant Orders   CBC with Differential/Platelet   Lipid panel   Comprehensive metabolic panel   VITAMIN D 25 Hydroxy (Vit-D Deficiency, Fractures)   Other Visit Diagnoses     Well woman exam    -  Primary   Relevant Orders   IGP,CtNgTv,Apt HPV   CBC with Differential/Platelet   Lipid panel   Comprehensive metabolic panel   VITAMIN D 25 Hydroxy (Vit-D Deficiency, Fractures)   Screening for cervical cancer       Relevant Orders   IGP,CtNgTv,Apt HPV   Screening for HPV (human papillomavirus)       Relevant Orders   IGP,CtNgTv,Apt HPV   Routine screening for STI (sexually transmitted infection)       Relevant Orders   IGP,CtNgTv,Apt HPV   Bacterial vaginosis       Relevant Medications   metroNIDAZOLE (FLAGYL) 500 MG tablet   Other Relevant Orders   POCT Wet Prep with KOH (Completed)      Meds ordered this encounter  Medications   metroNIDAZOLE (FLAGYL) 500 MG tablet    Sig: Take 1 tablet (500 mg total) by mouth 2 (two) times daily with a meal.    Dispense:  14 tablet    Refill:  0    Order Specific Question:   Supervising Provider    Answer:   A [[id number]]   gabapentin (NEURONTIN) 300 MG capsule    Sig: Take 1 capsule (300 mg total) by mouth 2 (two) times daily. For back pain    Dispense:  60 capsule    Refill:  0    Order Specific Question:   Supervising Provider    Answer:   Lilyan Punt A [[id number]]   Encouraged continued weight loss and activity. Trial of Gabapentin 300 mg BID. If this helps the sciatic pain, contact office for refills. Otherwise, continue follow up with specialist for back pain.  Call back if vaginitis persists.   Return in about 1 year (around 06/25/2023) for physical.

## 2022-06-25 ENCOUNTER — Other Ambulatory Visit: Payer: Self-pay | Admitting: Nurse Practitioner

## 2022-06-25 LAB — CBC WITH DIFFERENTIAL/PLATELET
Basophils Absolute: 0 10*3/uL (ref 0.0–0.2)
Basos: 0 %
EOS (ABSOLUTE): 0 10*3/uL (ref 0.0–0.4)
Eos: 0 %
Hematocrit: 42 % (ref 34.0–46.6)
Hemoglobin: 14.1 g/dL (ref 11.1–15.9)
Immature Grans (Abs): 0 10*3/uL (ref 0.0–0.1)
Immature Granulocytes: 0 %
Lymphocytes Absolute: 1.9 10*3/uL (ref 0.7–3.1)
Lymphs: 28 %
MCH: 31.2 pg (ref 26.6–33.0)
MCHC: 33.6 g/dL (ref 31.5–35.7)
MCV: 93 fL (ref 79–97)
Monocytes Absolute: 0.4 10*3/uL (ref 0.1–0.9)
Monocytes: 6 %
Neutrophils Absolute: 4.4 10*3/uL (ref 1.4–7.0)
Neutrophils: 66 %
Platelets: 292 10*3/uL (ref 150–450)
RBC: 4.52 x10E6/uL (ref 3.77–5.28)
RDW: 11.4 % — ABNORMAL LOW (ref 11.7–15.4)
WBC: 6.7 10*3/uL (ref 3.4–10.8)

## 2022-06-25 LAB — COMPREHENSIVE METABOLIC PANEL
ALT: 17 IU/L (ref 0–32)
AST: 17 IU/L (ref 0–40)
Albumin/Globulin Ratio: 1.8 (ref 1.2–2.2)
Albumin: 4.4 g/dL (ref 3.8–4.8)
Alkaline Phosphatase: 85 IU/L (ref 44–121)
BUN/Creatinine Ratio: 13 (ref 9–23)
BUN: 10 mg/dL (ref 6–24)
Bilirubin Total: 1.2 mg/dL (ref 0.0–1.2)
CO2: 22 mmol/L (ref 20–29)
Calcium: 9.5 mg/dL (ref 8.7–10.2)
Chloride: 105 mmol/L (ref 96–106)
Creatinine, Ser: 0.8 mg/dL (ref 0.57–1.00)
Globulin, Total: 2.5 g/dL (ref 1.5–4.5)
Glucose: 90 mg/dL (ref 70–99)
Potassium: 4.8 mmol/L (ref 3.5–5.2)
Sodium: 144 mmol/L (ref 134–144)
Total Protein: 6.9 g/dL (ref 6.0–8.5)
eGFR: 91 mL/min/{1.73_m2} (ref >59)

## 2022-06-25 LAB — LIPID PANEL
Chol/HDL Ratio: 3.9 ratio (ref 0.0–4.4)
Cholesterol, Total: 178 mg/dL (ref 100–199)
HDL: 46 mg/dL (ref >39)
LDL Chol Calc (NIH): 112 mg/dL — ABNORMAL HIGH (ref 0–99)
Triglycerides: 110 mg/dL (ref 0–149)
VLDL Cholesterol Cal: 20 mg/dL (ref 5–40)

## 2022-06-25 LAB — VITAMIN D 25 HYDROXY (VIT D DEFICIENCY, FRACTURES): Vit D, 25-Hydroxy: 17.3 ng/mL — ABNORMAL LOW (ref 30.0–100.0)

## 2022-06-25 MED ORDER — VITAMIN D (ERGOCALCIFEROL) 1.25 MG (50000 UNIT) PO CAPS: 50000.0000 [IU] | ORAL_CAPSULE | ORAL | 1 refills | Status: DC

## 2022-07-01 ENCOUNTER — Encounter: Payer: Self-pay | Admitting: Nurse Practitioner

## 2022-07-01 LAB — IGP,CTNGTV,APT HPV
Chlamydia, Nuc. Acid Amp: NEGATIVE
Gonococcus, Nuc. Acid Amp: NEGATIVE
Trich vag by NAA: NEGATIVE

## 2022-07-20 DIAGNOSIS — Z30430 Encounter for insertion of intrauterine contraceptive device: Secondary | ICD-10-CM | POA: Diagnosis not present

## 2022-07-29 ENCOUNTER — Other Ambulatory Visit: Payer: Self-pay | Admitting: Nurse Practitioner

## 2022-08-17 DIAGNOSIS — Z30430 Encounter for insertion of intrauterine contraceptive device: Secondary | ICD-10-CM | POA: Diagnosis not present

## 2022-08-25 ENCOUNTER — Other Ambulatory Visit: Payer: Self-pay | Admitting: Orthopedic Surgery

## 2022-08-25 ENCOUNTER — Other Ambulatory Visit: Payer: Self-pay | Admitting: Nurse Practitioner

## 2022-08-25 DIAGNOSIS — M5116 Intervertebral disc disorders with radiculopathy, lumbar region: Secondary | ICD-10-CM

## 2022-11-02 ENCOUNTER — Other Ambulatory Visit: Payer: Self-pay | Admitting: Family Medicine

## 2022-11-09 ENCOUNTER — Telehealth: Payer: Self-pay | Admitting: Orthopedic Surgery

## 2022-11-09 NOTE — Telephone Encounter (Signed)
Close encounter 

## 2022-11-13 ENCOUNTER — Other Ambulatory Visit: Payer: Self-pay | Admitting: Nurse Practitioner

## 2022-11-29 DIAGNOSIS — M222X2 Patellofemoral disorders, left knee: Secondary | ICD-10-CM | POA: Diagnosis not present

## 2022-11-29 DIAGNOSIS — M222X1 Patellofemoral disorders, right knee: Secondary | ICD-10-CM | POA: Diagnosis not present

## 2022-12-13 ENCOUNTER — Other Ambulatory Visit: Payer: Self-pay | Admitting: Orthopedic Surgery

## 2022-12-13 DIAGNOSIS — M545 Low back pain, unspecified: Secondary | ICD-10-CM

## 2022-12-18 ENCOUNTER — Other Ambulatory Visit: Payer: Self-pay | Admitting: Nurse Practitioner

## 2022-12-30 ENCOUNTER — Encounter: Payer: Self-pay | Admitting: Nurse Practitioner

## 2023-01-13 ENCOUNTER — Ambulatory Visit (INDEPENDENT_AMBULATORY_CARE_PROVIDER_SITE_OTHER): Payer: BLUE CROSS/BLUE SHIELD | Admitting: Nurse Practitioner

## 2023-01-13 ENCOUNTER — Encounter: Payer: Self-pay | Admitting: Nurse Practitioner

## 2023-01-13 VITALS — BP 101/68 | HR 75 | Temp 97.7°F | Ht 69.0 in | Wt 261.0 lb

## 2023-01-13 DIAGNOSIS — G5621 Lesion of ulnar nerve, right upper limb: Secondary | ICD-10-CM

## 2023-01-13 MED ORDER — PHENTERMINE HCL 37.5 MG PO TABS: 37.5000 mg | ORAL_TABLET | Freq: Every day | ORAL | 2 refills | Status: DC

## 2023-01-13 NOTE — Progress Notes (Signed)
Subjective:    Patient ID: Stacie Roberts, female    DOB: 09-09-1976, 47 y.o.   MRN: [id number]  HPI Patient follow up for weight loss medication phentermine has previously been on this medication 2022 Reports numbness in R pinky finger and tingling in R arm x 1 week Has been going to the gym 5 days per week. Currently doing a 6 week challenge. Main issue is emotional eating and sedentary job. Would like to restart Phentermine to help jump start weight loss. Has taken this without difficulty in the past.  C/o numbness localized to the right small finger x 1 week. No hand involvement. No wrist, elbow, shoulder or neck pain. Minimal weakness of the right hand compared to the left. 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 Also notices her hand "falling asleep" at night and has to shake it out.   Review of Systems  Constitutional:  Positive for appetite change.  Respiratory:  Negative for cough, chest tightness, shortness of breath and wheezing.   Cardiovascular:  Negative for chest pain and palpitations.  Musculoskeletal:  Negative for neck pain.  Neurological:  Positive for numbness.       Limited to the right small finger      01/13/2023    9:08 AM  Depression screen PHQ 2/9  Decreased Interest 0  Down, Depressed, Hopeless 0  PHQ - 2 Score 0  Altered sleeping 1  Tired, decreased energy 0  Change in appetite 1  Feeling bad or failure about yourself  0  Trouble concentrating 0  Moving slowly or fidgety/restless 0  Suicidal thoughts 0  PHQ-9 Score 2  Difficult doing work/chores Not difficult at all      01/13/2023    9:08 AM  GAD 7 : Generalized Anxiety Score  Nervous, Anxious, on Edge 0  Control/stop worrying 0  Worry too much - different things 0  Trouble relaxing 0  Restless 0  Easily annoyed or irritable 0  Afraid - awful might happen 0  Total GAD 7 Score 0  Anxiety Difficulty Not difficult at all         Objective:   Physical Exam NAD. Alert, oriented. Calm, cheerful  affect. Lungs clear. Heart RRR. Sensation equal and grossly intact in all fingers. Normal ROM of the right wrist and elbow without tenderness. Normal bilateral shoulder movement and arm strength. Normal ROM of the neck without tenderness. Hand strength 5+ bilaterally. Strong radial pulse with normal cap refill right side.  Today's Vitals   01/13/23 0857  BP: 101/68  Pulse: 75  Temp: 97.7 F (36.5 C)  SpO2: 96%  Weight: 261 lb (118.4 kg)  Height: 5\' 9"  (1.753 m)   Body mass index is 38.54 kg/m.        Assessment & Plan:   Problem List Items Addressed This Visit       Nervous and Auditory   Ulnar neuropathy of right upper extremity - Primary   Relevant Medications   phentermine (ADIPEX-P) 37.5 MG tablet     Other   Morbid obesity (HCC) (Chronic)   Relevant Medications   phentermine (ADIPEX-P) 37.5 MG tablet   Meds ordered this encounter  Medications   phentermine (ADIPEX-P) 37.5 MG tablet    Sig: Take 1 tablet (37.5 mg total) by mouth daily before breakfast.    Dispense:  30 tablet    Refill:  2    Order Specific Question:   Supervising Provider    Answer:   Sallee Lange A [[id number]]  Restart Phentermine as directed. Reviewed potential adverse effects. DC if any problems. Continue healthy lifestyle habits. Given rx for velcro wrist brace. If no improvement over the next couple of weeks, call back for referral to hand specialist.  Return in about 3 months (around 04/14/2023).

## 2023-01-24 ENCOUNTER — Other Ambulatory Visit: Payer: Self-pay | Admitting: Orthopedic Surgery

## 2023-01-24 ENCOUNTER — Other Ambulatory Visit: Payer: Self-pay | Admitting: Family Medicine

## 2023-01-24 DIAGNOSIS — M5116 Intervertebral disc disorders with radiculopathy, lumbar region: Secondary | ICD-10-CM

## 2023-02-14 ENCOUNTER — Encounter: Payer: Self-pay | Admitting: Nurse Practitioner

## 2023-02-14 NOTE — Telephone Encounter (Signed)
Patient is going to check with her insurance to see if Zepbound is covered and then let us know what she would like to do

## 2023-02-16 ENCOUNTER — Encounter: Payer: Self-pay | Admitting: Radiology

## 2023-02-17 ENCOUNTER — Other Ambulatory Visit: Payer: Self-pay | Admitting: Orthopedic Surgery

## 2023-02-17 DIAGNOSIS — M545 Pain in left leg: Secondary | ICD-10-CM

## 2023-03-10 ENCOUNTER — Other Ambulatory Visit: Payer: Self-pay | Admitting: Family Medicine

## 2023-04-04 ENCOUNTER — Other Ambulatory Visit (HOSPITAL_COMMUNITY): Payer: Self-pay | Admitting: Nurse Practitioner

## 2023-04-04 DIAGNOSIS — Z1231 Encounter for screening mammogram for malignant neoplasm of breast: Secondary | ICD-10-CM

## 2023-04-14 ENCOUNTER — Ambulatory Visit (INDEPENDENT_AMBULATORY_CARE_PROVIDER_SITE_OTHER): Payer: BLUE CROSS/BLUE SHIELD | Admitting: Nurse Practitioner

## 2023-04-14 VITALS — BP 120/84 | Ht 69.0 in | Wt 263.8 lb

## 2023-04-14 DIAGNOSIS — E669 Obesity, unspecified: Secondary | ICD-10-CM | POA: Diagnosis not present

## 2023-04-14 DIAGNOSIS — M5442 Lumbago with sciatica, left side: Secondary | ICD-10-CM

## 2023-04-14 MED ORDER — PHENTERMINE HCL 37.5 MG PO TABS: 37.5000 mg | ORAL_TABLET | Freq: Every day | ORAL | 2 refills | Status: DC

## 2023-04-14 MED ORDER — AMITRIPTYLINE HCL 10 MG PO TABS: 10.0000 mg | ORAL_TABLET | Freq: Every day | ORAL | 0 refills | Status: DC

## 2023-04-14 NOTE — Progress Notes (Unsigned)
Subjective:    Patient ID: Stacie Roberts, female    DOB: April 02, 1976, 47 y.o.   MRN: [id number]  HPI  Patient arrives for a follow up on Adipex. Patient states she is overall doing well but is having problems with sleep- not sleeping well. One of her main issues is her chronic low back pain.  Has been seeing Dr. Romeo Apple.  Her most recent refill of gabapentin was done by Dr. Lorin Picket.  No relief with current dose of gabapentin.  No relief with Tylenol or Advil PM as far as pain or sleep.  Mostly works from home.  Mostly sitting during the day for her job.  Does use a pillow between her knees to help her back pain.  Her diet includes drinking more water with limited sugary beverages.  Has also been practicing intermittent fasting.  Due to her schedule and back pain, her activity has been limited but plans to restart working out on a regular basis.  She has done this before with good success with weight loss.  Denies any adverse effects from phentermine.  No chest pain shortness of breath or palpitations.  States that phentermine does not affect her sleep.        Objective:   Physical Exam NAD.  Alert, oriented.  Lungs clear.  Heart regular rate rhythm.  Gait slow but steady. Today's Vitals   04/14/23 0853  BP: 120/84  Weight: 263 lb 12.8 oz (119.7 kg)  Height: 5\' 9"  (1.753 m)   Body mass index is 38.96 kg/m.        Assessment & Plan:   Problem List Items Addressed This Visit       Nervous and Auditory   Acute midline low back pain with left-sided sciatica - Primary   Relevant Medications   amitriptyline (ELAVIL) 10 MG tablet   phentermine (ADIPEX-P) 37.5 MG tablet     Other   Obesity with body mass index of 30.0-39.9   Relevant Medications   phentermine (ADIPEX-P) 37.5 MG tablet   Meds ordered this encounter  Medications   amitriptyline (ELAVIL) 10 MG tablet    Sig: Take 1 tablet (10 mg total) by mouth at bedtime. May increase to 2 tabs (20 mg) at bedtime if  needed.    Dispense:  30 tablet    Refill:  0    Order Specific Question:   Supervising Provider    Answer:   Lilyan Punt A [[id number]]   phentermine (ADIPEX-P) 37.5 MG tablet    Sig: Take 1 tablet (37.5 mg total) by mouth daily before breakfast.    Dispense:  30 tablet    Refill:  2    Order Specific Question:   Supervising Provider    Answer:   Lilyan Punt A [[id number]]   Reduce gabapentin dose to one 300 mg capsule daily for several days then discontinue.  Start amitriptyline 10 mg at bedtime.  If needed and tolerated increase to 2 p.o. at bedtime after 1 week.  Patient verbalizes understanding.  Reviewed potential adverse effects of amitriptyline.  Call back in 2 to 3 weeks if no improvement, sooner if worse.  Reviewed sleep hygiene including limiting caffeine in the evenings.  Patient to continue follow-up with Dr. Romeo Apple for her chronic back pain.  Encouraged her to restart regular exercise as tolerated.  Trial of phentermine for maximum of 3 more months and then discontinue.  Patient given a prescription for a standing desk to help limit chronic sitting which may be  worsening her back pain. Reminded patient about preventive health physical and lab work due this summer.

## 2023-04-15 ENCOUNTER — Encounter: Payer: Self-pay | Admitting: Nurse Practitioner

## 2023-04-15 DIAGNOSIS — E669 Obesity, unspecified: Secondary | ICD-10-CM | POA: Insufficient documentation

## 2023-04-21 ENCOUNTER — Other Ambulatory Visit: Payer: Self-pay | Admitting: Nurse Practitioner

## 2023-04-26 ENCOUNTER — Other Ambulatory Visit: Payer: Self-pay | Admitting: Family Medicine

## 2023-04-26 DIAGNOSIS — Z1231 Encounter for screening mammogram for malignant neoplasm of breast: Secondary | ICD-10-CM

## 2023-05-03 ENCOUNTER — Other Ambulatory Visit: Payer: Self-pay | Admitting: Family Medicine

## 2023-05-05 ENCOUNTER — Ambulatory Visit (HOSPITAL_COMMUNITY): Payer: BLUE CROSS/BLUE SHIELD

## 2023-05-09 ENCOUNTER — Ambulatory Visit
Admission: RE | Admit: 2023-05-09 | Discharge: 2023-05-09 | Disposition: A | Discharge status: Home / Self Care | Payer: BLUE CROSS/BLUE SHIELD | Source: Ambulatory Visit

## 2023-05-09 DIAGNOSIS — Z1231 Encounter for screening mammogram for malignant neoplasm of breast: Secondary | ICD-10-CM | POA: Diagnosis not present

## 2023-06-26 ENCOUNTER — Ambulatory Visit: Payer: BLUE CROSS/BLUE SHIELD | Admitting: Nurse Practitioner

## 2023-06-26 VITALS — BP 100/74 | HR 69 | Temp 97.5°F | Ht 69.0 in | Wt 260.0 lb

## 2023-06-26 DIAGNOSIS — Z01411 Encounter for gynecological examination (general) (routine) with abnormal findings: Secondary | ICD-10-CM | POA: Diagnosis not present

## 2023-06-26 DIAGNOSIS — Z01419 Encounter for gynecological examination (general) (routine) without abnormal findings: Secondary | ICD-10-CM

## 2023-06-26 DIAGNOSIS — Z124 Encounter for screening for malignant neoplasm of cervix: Secondary | ICD-10-CM

## 2023-06-26 DIAGNOSIS — Z113 Encounter for screening for infections with a predominantly sexual mode of transmission: Secondary | ICD-10-CM

## 2023-06-26 DIAGNOSIS — Z1151 Encounter for screening for human papillomavirus (HPV): Secondary | ICD-10-CM

## 2023-06-26 DIAGNOSIS — Z114 Encounter for screening for human immunodeficiency virus [HIV]: Secondary | ICD-10-CM

## 2023-06-26 DIAGNOSIS — Z13220 Encounter for screening for lipoid disorders: Secondary | ICD-10-CM

## 2023-06-26 NOTE — Progress Notes (Unsigned)
   Subjective:    Patient ID: Stacie Roberts, female    DOB: 1975/12/27, 47 y.o.   MRN: [id number]  HPI The patient comes in today for a wellness visit.    A review of their health history was completed.  A review of medications was also completed.  Any needed refills; methocarbamol  Eating habits: fair  Falls/  MVA accidents in past few months: no  Regular exercise: 30 to 60 mins 3 to 5 x weekly  Specialist pt sees on regular basis: ortho knees and back  Preventative health issues were discussed.   Additional concerns:  none   Review of Systems     Objective:   Physical Exam        Assessment & Plan:

## 2023-06-27 ENCOUNTER — Encounter: Payer: Self-pay | Admitting: Nurse Practitioner

## 2023-06-27 LAB — COMPREHENSIVE METABOLIC PANEL
ALT: 24 IU/L (ref 0–32)
AST: 23 IU/L (ref 0–40)
Albumin: 4.3 g/dL (ref 3.9–4.9)
Alkaline Phosphatase: 106 IU/L (ref 44–121)
BUN/Creatinine Ratio: 13 (ref 9–23)
BUN: 12 mg/dL (ref 6–24)
Bilirubin Total: 1 mg/dL (ref 0.0–1.2)
CO2: 26 mmol/L (ref 20–29)
Calcium: 10.1 mg/dL (ref 8.7–10.2)
Chloride: 103 mmol/L (ref 96–106)
Creatinine, Ser: 0.95 mg/dL (ref 0.57–1.00)
Globulin, Total: 3.2 g/dL (ref 1.5–4.5)
Glucose: 70 mg/dL (ref 70–99)
Potassium: 4.3 mmol/L (ref 3.5–5.2)
Sodium: 141 mmol/L (ref 134–144)
Total Protein: 7.5 g/dL (ref 6.0–8.5)
eGFR: 74 mL/min/{1.73_m2} (ref >59)

## 2023-06-27 LAB — CBC WITH DIFFERENTIAL/PLATELET
Basophils Absolute: 0 10*3/uL (ref 0.0–0.2)
Basos: 1 %
EOS (ABSOLUTE): 0 10*3/uL (ref 0.0–0.4)
Eos: 0 %
Hematocrit: 44.1 % (ref 34.0–46.6)
Hemoglobin: 14.6 g/dL (ref 11.1–15.9)
Immature Grans (Abs): 0 10*3/uL (ref 0.0–0.1)
Immature Granulocytes: 0 %
Lymphocytes Absolute: 2.2 10*3/uL (ref 0.7–3.1)
Lymphs: 25 %
MCH: 31 pg (ref 26.6–33.0)
MCHC: 33.1 g/dL (ref 31.5–35.7)
MCV: 94 fL (ref 79–97)
Monocytes Absolute: 0.4 10*3/uL (ref 0.1–0.9)
Monocytes: 5 %
Neutrophils Absolute: 5.9 10*3/uL (ref 1.4–7.0)
Neutrophils: 69 %
Platelets: 331 10*3/uL (ref 150–450)
RBC: 4.71 x10E6/uL (ref 3.77–5.28)
RDW: 11.6 % — ABNORMAL LOW (ref 11.7–15.4)
WBC: 8.5 10*3/uL (ref 3.4–10.8)

## 2023-06-27 LAB — LIPID PANEL
Chol/HDL Ratio: 4 ratio (ref 0.0–4.4)
Cholesterol, Total: 185 mg/dL (ref 100–199)
HDL: 46 mg/dL (ref >39)
LDL Chol Calc (NIH): 116 mg/dL — ABNORMAL HIGH (ref 0–99)
Triglycerides: 126 mg/dL (ref 0–149)
VLDL Cholesterol Cal: 23 mg/dL (ref 5–40)

## 2023-06-27 LAB — HIV ANTIBODY (ROUTINE TESTING W REFLEX): HIV Screen 4th Generation wRfx: NONREACTIVE

## 2023-06-27 LAB — TSH: TSH: 0.674 u[IU]/mL (ref 0.450–4.500)

## 2023-06-27 LAB — RPR: RPR Ser Ql: NONREACTIVE

## 2023-06-30 ENCOUNTER — Encounter: Payer: Self-pay | Admitting: Nurse Practitioner

## 2023-06-30 ENCOUNTER — Other Ambulatory Visit: Payer: Self-pay | Admitting: Nurse Practitioner

## 2023-06-30 LAB — IGP,CTNGTV,APT HPV
Chlamydia, Nuc. Acid Amp: NEGATIVE
Gonococcus, Nuc. Acid Amp: NEGATIVE
HPV Aptima: NEGATIVE
Trich vag by NAA: NEGATIVE

## 2023-06-30 MED ORDER — FLUCONAZOLE 150 MG PO TABS: ORAL_TABLET | ORAL | 0 refills | Status: AC

## 2023-07-03 ENCOUNTER — Other Ambulatory Visit: Payer: Self-pay

## 2023-07-03 DIAGNOSIS — E559 Vitamin D deficiency, unspecified: Secondary | ICD-10-CM

## 2023-07-18 ENCOUNTER — Other Ambulatory Visit: Payer: Self-pay | Admitting: Nurse Practitioner

## 2023-07-18 MED ORDER — VITAMIN D (ERGOCALCIFEROL) 1.25 MG (50000 UNIT) PO CAPS: 50000.0000 [IU] | ORAL_CAPSULE | ORAL | 0 refills | Status: AC

## 2023-08-21 ENCOUNTER — Other Ambulatory Visit: Payer: Self-pay | Admitting: Orthopedic Surgery

## 2023-08-21 DIAGNOSIS — M79605 Pain in left leg: Secondary | ICD-10-CM

## 2023-10-03 ENCOUNTER — Other Ambulatory Visit: Payer: Self-pay | Admitting: Family Medicine

## 2023-11-13 ENCOUNTER — Other Ambulatory Visit: Payer: Self-pay | Admitting: Nurse Practitioner

## 2023-11-22 ENCOUNTER — Other Ambulatory Visit: Payer: Self-pay | Admitting: Nurse Practitioner

## 2023-11-28 ENCOUNTER — Other Ambulatory Visit: Payer: Self-pay | Admitting: Nurse Practitioner

## 2023-12-05 ENCOUNTER — Other Ambulatory Visit: Payer: Self-pay | Admitting: Nurse Practitioner

## 2023-12-05 ENCOUNTER — Encounter: Payer: Self-pay | Admitting: Nurse Practitioner

## 2023-12-05 DIAGNOSIS — M5116 Intervertebral disc disorders with radiculopathy, lumbar region: Secondary | ICD-10-CM

## 2023-12-05 MED ORDER — AMITRIPTYLINE HCL 10 MG PO TABS: ORAL_TABLET | ORAL | 1 refills | Status: AC

## 2023-12-17 ENCOUNTER — Telehealth: Payer: BLUE CROSS/BLUE SHIELD | Admitting: Family

## 2023-12-17 DIAGNOSIS — J069 Acute upper respiratory infection, unspecified: Secondary | ICD-10-CM | POA: Diagnosis not present

## 2023-12-17 MED ORDER — BENZONATATE 100 MG PO CAPS: 100.0000 mg | ORAL_CAPSULE | Freq: Three times a day (TID) | ORAL | 0 refills | Status: AC | PRN

## 2023-12-17 MED ORDER — FLUTICASONE PROPIONATE 50 MCG/ACT NA SUSP: 2.0000 | Freq: Every day | NASAL | 6 refills | Status: AC

## 2023-12-17 MED ORDER — CETIRIZINE HCL 10 MG PO TABS: 10.0000 mg | ORAL_TABLET | Freq: Every day | ORAL | 1 refills | Status: AC

## 2023-12-17 NOTE — Progress Notes (Signed)

## 2024-04-09 ENCOUNTER — Other Ambulatory Visit: Payer: Self-pay | Admitting: Orthopedic Surgery

## 2024-04-09 DIAGNOSIS — M545 Low back pain, unspecified: Secondary | ICD-10-CM

## 2024-06-19 ENCOUNTER — Other Ambulatory Visit: Payer: Self-pay | Admitting: Family

## 2024-06-19 DIAGNOSIS — J069 Acute upper respiratory infection, unspecified: Secondary | ICD-10-CM

## 2024-11-29 ENCOUNTER — Telehealth: Payer: Self-pay

## 2024-11-29 NOTE — Telephone Encounter (Signed)
 RN received internal recall report noting colonoscopy due.  After chart review, RN noted patient transfer to [facility] and colonoscopy was done recently w/ [facility].

## 2025-01-08 ENCOUNTER — Other Ambulatory Visit: Payer: Self-pay | Admitting: Orthopedic Surgery

## 2025-01-08 DIAGNOSIS — M79605 Pain in left leg: Secondary | ICD-10-CM

## 2025-01-08 NOTE — Telephone Encounter (Signed)
 Refill request received via fax for  Methocarbamol  [address]  [employer]

## 2025-01-09 MED ORDER — METHOCARBAMOL 750 MG PO TABS: 750.0000 mg | ORAL_TABLET | Freq: Four times a day (QID) | ORAL | 5 refills | Status: AC
